# Patient Record
Sex: Male | Born: 1950 | Race: White | Hispanic: No | Marital: Married | Smoking: Never smoker
Health system: Southern US, Community
[De-identification: ages and names within clinical notes are randomized; demographics above are authoritative.]

## PROBLEM LIST (undated history)

## (undated) DIAGNOSIS — I2510 Atherosclerotic heart disease of native coronary artery without angina pectoris: Secondary | ICD-10-CM

## (undated) DIAGNOSIS — E119 Type 2 diabetes mellitus without complications: Secondary | ICD-10-CM

## (undated) DIAGNOSIS — E7800 Pure hypercholesterolemia, unspecified: Secondary | ICD-10-CM

## (undated) DIAGNOSIS — I255 Ischemic cardiomyopathy: Secondary | ICD-10-CM

## (undated) DIAGNOSIS — I513 Intracardiac thrombosis, not elsewhere classified: Secondary | ICD-10-CM

## (undated) DIAGNOSIS — I10 Essential (primary) hypertension: Secondary | ICD-10-CM

## (undated) DIAGNOSIS — Z91018 Allergy to other foods: Secondary | ICD-10-CM

## (undated) DIAGNOSIS — N1831 Chronic kidney disease, stage 3a: Secondary | ICD-10-CM

## (undated) DIAGNOSIS — I252 Old myocardial infarction: Secondary | ICD-10-CM

## (undated) DIAGNOSIS — N19 Unspecified kidney failure: Secondary | ICD-10-CM

## (undated) HISTORY — PX: HERNIA REPAIR: SHX51

## (undated) HISTORY — PX: CORONARY ARTERY BYPASS GRAFT: SHX141

---

## 1998-07-16 HISTORY — PX: OTHER SURGICAL HISTORY: SHX169

## 2011-07-17 HISTORY — PX: CORONARY ARTERY BYPASS GRAFT: SHX141

## 2020-05-21 ENCOUNTER — Encounter: Payer: Self-pay | Admitting: Emergency Medicine

## 2020-05-21 ENCOUNTER — Other Ambulatory Visit: Payer: Self-pay

## 2020-05-21 ENCOUNTER — Ambulatory Visit
Admission: EM | Admit: 2020-05-21 | Discharge: 2020-05-21 | Disposition: A | Discharge status: Home / Self Care | Payer: BC Managed Care – PPO | Attending: Emergency Medicine | Admitting: Emergency Medicine

## 2020-05-21 DIAGNOSIS — J069 Acute upper respiratory infection, unspecified: Secondary | ICD-10-CM

## 2020-05-21 HISTORY — DX: Old myocardial infarction: I25.2

## 2020-05-21 HISTORY — DX: Pure hypercholesterolemia, unspecified: E78.00

## 2020-05-21 HISTORY — DX: Essential (primary) hypertension: I10

## 2020-05-21 HISTORY — DX: Unspecified kidney failure: N19

## 2020-05-21 HISTORY — DX: Type 2 diabetes mellitus without complications: E11.9

## 2020-05-21 MED ORDER — CETIRIZINE HCL 10 MG PO TABS: 10.0000 mg | ORAL_TABLET | Freq: Every day | ORAL | 0 refills | Status: DC

## 2020-05-21 MED ORDER — AZITHROMYCIN 250 MG PO TABS: 250.0000 mg | ORAL_TABLET | Freq: Every day | ORAL | 0 refills | Status: DC

## 2020-05-21 MED ORDER — BENZONATATE 100 MG PO CAPS: 100.0000 mg | ORAL_CAPSULE | Freq: Three times a day (TID) | ORAL | 0 refills | Status: DC

## 2020-05-21 MED ORDER — DEXAMETHASONE 4 MG PO TABS: 4.0000 mg | ORAL_TABLET | Freq: Every day | ORAL | 0 refills | Status: AC

## 2020-05-21 NOTE — Discharge Instructions (Signed)
°  Get plenty of rest and push fluids Tessalon Perles prescribed for cough Zyrtec for nasal congestion, runny nose, and/or sore throat Decadron was prescribed Azithromycin was prescribed Use medications daily for symptom relief Use OTC medications like ibuprofen or tylenol as needed fever or pain Call or go to the ED if you have any new or worsening symptoms such as fever, worsening cough, shortness of breath, chest tightness, chest pain, turning blue, changes in mental status, etc..Marland Kitchen

## 2020-05-21 NOTE — ED Provider Notes (Signed)
[facility]   [id number] 05/21/20 Arrival Time: 1130   CC: URI symptoms  SUBJECTIVE: History from: patient.  Martin Hooper is a 69 y.o. male who presents to the urgent care for complaint of cough, nasal congestion and sore throat that started yesterday.  Denies sick exposure to COVID, flu or strep.  Denies recent travel.  Has tried OTC medication without relief.  Denies aggravating factors.  Denies previous symptoms in the past.   Denies fever, chills, fatigue, sinus pain, rhinorrhea,  SOB, wheezing, chest pain, nausea, changes in bowel or bladder habits.     ROS: As per HPI.  All other pertinent ROS negative.      Past Medical History:  Diagnosis Date  . Diabetes mellitus without complication (HCC)   . High cholesterol   . Hypertension   . Kidney failure    stage 3  . 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 CORONARY ARTERY BYPASS GRAFT     x 8 years ago   . HERNIA REPAIR     No Known Allergies No current facility-administered medications on file prior to encounter.   No current outpatient medications on file prior to encounter.   Social History   Socioeconomic History  . Marital status: Married    Spouse name: Not on file  . Number of children: Not on file  . Years of education: Not on file  . Highest education level: Not on file  Occupational History  . Not on file  Tobacco Use  . Smoking status: Never Smoker  . Smokeless tobacco: Never Used  Substance and Sexual Activity  . Alcohol use: Never  . Drug use: Never  . Sexual activity: Not on file  Other Topics Concern  . Not on file  Social History Narrative  . Not on file   Social Determinants of Health   Financial Resource Strain:   . Difficulty of Paying Living Expenses: Not on file  Food Insecurity:   . Worried About Programme researcher, broadcasting/film/video in the Last Year: Not on file  . Ran Out of Food in the Last Year: Not on file  Transportation Needs:   . Lack  of Transportation (Medical): Not on file  . Lack of Transportation (Non-Medical): Not on file  Physical Activity:   . Days of Exercise per Week: Not on file  . Minutes of Exercise per Session: Not on file  Stress:   . Feeling of Stress : Not on file  Social Connections:   . Frequency of Communication with Friends and Family: Not on file  . Frequency of Social Gatherings with Friends and Family: Not on file  . Attends Religious Services: Not on file  . Active Member of Clubs or Organizations: Not on file  . Attends Banker Meetings: Not on file  . Marital Status: Not on file  Intimate Partner Violence:   . Fear of Current or Ex-Partner: Not on file  . Emotionally Abused: Not on file  . Physically Abused: Not on file  . Sexually Abused: Not on file   No family history on file.  OBJECTIVE:  Vitals:   05/21/20 1150 05/21/20 1151  BP: 126/70   Pulse: 69   Resp: 19   Temp: 98.2 F (36.8 C)   TempSrc: Oral   SpO2: 96%   Weight:  205 lb (93 kg)  Height:  5\' 8"  (1.727 m)     General appearance:  alert; appears fatigued, but nontoxic; speaking in full sentences and tolerating own secretions HEENT: NCAT; Ears: EACs clear, TMs pearly gray; Eyes: PERRL.  EOM grossly intact. Sinuses: nontender; Nose: nares patent without rhinorrhea, Throat: oropharynx clear, tonsils non erythematous or enlarged, uvula midline  Neck: supple without LAD Lungs: unlabored respirations, symmetrical air entry; cough: moderate; no respiratory distress; CTAB Heart: regular rate and rhythm.  Radial pulses 2+ symmetrical bilaterally Skin: warm and dry Psychological: alert and cooperative; normal mood and affect  LABS:  No results found for this or any previous visit (from the past 24 hour(s)).   ASSESSMENT & PLAN:  1. Acute URI     Meds ordered this encounter  Medications  . benzonatate (TESSALON) 100 MG capsule    Sig: Take 1 capsule (100 mg total) by mouth every 8 (eight) hours.     Dispense:  30 capsule    Refill:  0  . azithromycin (ZITHROMAX) 250 MG tablet    Sig: Take 1 tablet (250 mg total) by mouth daily. Take first 2 tablets together, then 1 every day until finished.    Dispense:  6 tablet    Refill:  0  . dexamethasone (DECADRON) 4 MG tablet    Sig: Take 1 tablet (4 mg total) by mouth daily for 7 days.    Dispense:  7 tablet    Refill:  0  . cetirizine (ZYRTEC ALLERGY) 10 MG tablet    Sig: Take 1 tablet (10 mg total) by mouth daily.    Dispense:  30 tablet    Refill:  0   Discharge instructions  Get plenty of rest and push fluids Tessalon Perles prescribed for cough Zyrtec for nasal congestion, runny nose, and/or sore throat Decadron was prescribed Azithromycin was prescribed Use medications daily for symptom relief Use OTC medications like ibuprofen or tylenol as needed fever or pain Call or go to the ED if you have any new or worsening symptoms such as fever, worsening cough, shortness of breath, chest tightness, chest pain, turning blue, changes in mental status, etc...   Reviewed expectations re: course of current medical issues. Questions answered. Outlined signs and symptoms indicating need for more acute intervention. Patient verbalized understanding. After Visit Summary given.         Durward Parcel, FNP 05/21/20 1212

## 2020-05-21 NOTE — ED Triage Notes (Signed)
Runny nose, mild cough and sore throat since yesterday

## 2020-06-04 ENCOUNTER — Ambulatory Visit
Admission: EM | Admit: 2020-06-04 | Discharge: 2020-06-04 | Disposition: A | Discharge status: Home / Self Care | Payer: BC Managed Care – PPO | Attending: Emergency Medicine | Admitting: Emergency Medicine

## 2020-06-04 ENCOUNTER — Encounter: Payer: Self-pay | Admitting: Emergency Medicine

## 2020-06-04 ENCOUNTER — Other Ambulatory Visit: Payer: Self-pay

## 2020-06-04 DIAGNOSIS — R059 Cough, unspecified: Secondary | ICD-10-CM

## 2020-06-04 MED ORDER — ALBUTEROL SULFATE HFA 108 (90 BASE) MCG/ACT IN AERS
1.0000 | INHALATION_SPRAY | Freq: Four times a day (QID) | RESPIRATORY_TRACT | 0 refills | Status: DC | PRN
Start: 2020-06-04 — End: 2022-07-03

## 2020-06-04 MED ORDER — BENZONATATE 100 MG PO CAPS: 100.0000 mg | ORAL_CAPSULE | Freq: Three times a day (TID) | ORAL | 0 refills | Status: DC

## 2020-06-04 NOTE — Discharge Instructions (Addendum)
Tessalon Perles prescribed for cough/take as directed.  No more than 6 tablets in a day ProAir was prescribed/take as directed Use medications daily for symptom relief Use OTC medications like ibuprofen or tylenol as needed fever or pain Call or go to the ED if you have any new or worsening symptoms such as fever, worsening cough, shortness of breath, chest tightness, chest pain, turning blue, changes in mental status, etc..Marland Kitchen

## 2020-06-04 NOTE — ED Triage Notes (Signed)
Patient has dry cough x couple weeks, has finished meds. States he thinks he has bronchitis

## 2020-06-04 NOTE — ED Provider Notes (Signed)
[facility]   [id number] 06/04/20 Arrival Time: 1224   Chief Complaint  Patient presents with  . Cough     SUBJECTIVE: History from: patient and family.  Martin Hooper is a 69 y.o. male presented to the urgent care with a complaint of dry cough that is getting worse last night.  States he was seen previously 2 weeks ago and was prescribed azithromycin, Decadron, Tessalon Perles.  Reports symptom improvement.  Now patient states  he started having a dry cough again 2 days ago.  Denies sick exposure to COVID, flu or strep.  Denies recent travel.  . Denies any aggravating factors.  Denies previous symptoms in the past.   Denies fever, chills, fatigue, sinus pain, rhinorrhea, sore throat, SOB, wheezing, chest pain, nausea, changes in bowel or bladder habits.     ROS: As per HPI.  All other pertinent ROS negative.     Past Medical History:  Diagnosis Date  . Diabetes mellitus without complication (HCC)   . High cholesterol   . Hypertension   . Kidney failure    stage 3  . 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 CORONARY ARTERY BYPASS GRAFT     x 8 years ago   . HERNIA REPAIR     No Known Allergies No current facility-administered medications on file prior to encounter.   Current Outpatient Medications on File Prior to Encounter  Medication Sig Dispense Refill  . azithromycin (ZITHROMAX) 250 MG tablet Take 1 tablet (250 mg total) by mouth daily. Take first 2 tablets together, then 1 every day until finished. 6 tablet 0  . cetirizine (ZYRTEC ALLERGY) 10 MG tablet Take 1 tablet (10 mg total) by mouth daily. 30 tablet 0   Social History   Socioeconomic History  . Marital status: Married    Spouse name: Not on file  . Number of children: Not on file  . Years of education: Not on file  . Highest education level: Not on file  Occupational History  . Not on file  Tobacco Use  . Smoking status: Never Smoker  . Smokeless  tobacco: Never Used  Substance and Sexual Activity  . Alcohol use: Never  . Drug use: Never  . Sexual activity: Not on file  Other Topics Concern  . Not on file  Social History Narrative  . Not on file   Social Determinants of Health   Financial Resource Strain:   . Difficulty of Paying Living Expenses: Not on file  Food Insecurity:   . Worried About Programme researcher, broadcasting/film/video in the Last Year: Not on file  . Ran Out of Food in the Last Year: Not on file  Transportation Needs:   . Lack of Transportation (Medical): Not on file  . Lack of Transportation (Non-Medical): Not on file  Physical Activity:   . Days of Exercise per Week: Not on file  . Minutes of Exercise per Session: Not on file  Stress:   . Feeling of Stress : Not on file  Social Connections:   . Frequency of Communication with Friends and Family: Not on file  . Frequency of Social Gatherings with Friends and Family: Not on file  . Attends Religious Services: Not on file  . Active Member of Clubs or Organizations: Not on file  . Attends Banker Meetings: Not on file  . Marital Status: Not on file  Intimate Partner Violence:   .  Fear of Current or Ex-Partner: Not on file  . Emotionally Abused: Not on file  . Physically Abused: Not on file  . Sexually Abused: Not on file   No family history on file.  OBJECTIVE:  Vitals:   06/04/20 1314  BP: 105/61  Pulse: (!) 57  Resp: 16  Temp: 98.3 F (36.8 C)  SpO2: 98%     General appearance: alert; appears fatigued, but nontoxic; speaking in full sentences and tolerating own secretions HEENT: NCAT; Ears: EACs clear, TMs pearly gray; Eyes: PERRL.  EOM grossly intact. Sinuses: nontender; Nose: nares patent without rhinorrhea, Throat: oropharynx clear, tonsils non erythematous or enlarged, uvula midline  Neck: supple without LAD Lungs: unlabored respirations, symmetrical air entry; cough: moderate; no respiratory distress; CTAB Heart: regular rate and rhythm.   Radial pulses 2+ symmetrical bilaterally Skin: warm and dry Psychological: alert and cooperative; normal mood and affect  LABS:  No results found for this or any previous visit (from the past 24 hour(s)).   ASSESSMENT & PLAN:  1. Cough     Meds ordered this encounter  Medications  . albuterol (VENTOLIN HFA) 108 (90 Base) MCG/ACT inhaler    Sig: Inhale 1-2 puffs into the lungs every 6 (six) hours as needed for wheezing or shortness of breath.    Dispense:  18 g    Refill:  0  . benzonatate (TESSALON) 100 MG capsule    Sig: Take 1 capsule (100 mg total) by mouth every 8 (eight) hours.    Dispense:  30 capsule    Refill:  0    Discharge instructions  Tessalon Perles prescribed for cough/take as directed.  No more than 6 tablets in a day ProAir was prescribed/take as directed Use medications daily for symptom relief Use OTC medications like ibuprofen or tylenol as needed fever or pain Call or go to the ED if you have any new or worsening symptoms such as fever, worsening cough, shortness of breath, chest tightness, chest pain, turning blue, changes in mental status, etc...   Reviewed expectations re: course of current medical issues. Questions answered. Outlined signs and symptoms indicating need for more acute intervention. Patient verbalized understanding. After Visit Summary given.         Durward Parcel, FNP 06/04/20 1413

## 2020-06-08 ENCOUNTER — Other Ambulatory Visit: Payer: Self-pay

## 2020-06-08 ENCOUNTER — Emergency Department (HOSPITAL_COMMUNITY): Payer: BC Managed Care – PPO

## 2020-06-08 ENCOUNTER — Encounter (HOSPITAL_COMMUNITY): Payer: Self-pay | Admitting: Emergency Medicine

## 2020-06-08 ENCOUNTER — Emergency Department (HOSPITAL_COMMUNITY)
Admission: EM | Admit: 2020-06-08 | Discharge: 2020-06-08 | Disposition: A | Discharge status: Home / Self Care | Payer: BC Managed Care – PPO | Attending: Emergency Medicine | Admitting: Emergency Medicine

## 2020-06-08 DIAGNOSIS — I129 Hypertensive chronic kidney disease with stage 1 through stage 4 chronic kidney disease, or unspecified chronic kidney disease: Secondary | ICD-10-CM | POA: Diagnosis not present

## 2020-06-08 DIAGNOSIS — E1169 Type 2 diabetes mellitus with other specified complication: Secondary | ICD-10-CM | POA: Diagnosis not present

## 2020-06-08 DIAGNOSIS — I249 Acute ischemic heart disease, unspecified: Secondary | ICD-10-CM | POA: Insufficient documentation

## 2020-06-08 DIAGNOSIS — E1122 Type 2 diabetes mellitus with diabetic chronic kidney disease: Secondary | ICD-10-CM | POA: Diagnosis not present

## 2020-06-08 DIAGNOSIS — N1831 Chronic kidney disease, stage 3a: Secondary | ICD-10-CM | POA: Insufficient documentation

## 2020-06-08 DIAGNOSIS — R0602 Shortness of breath: Secondary | ICD-10-CM | POA: Insufficient documentation

## 2020-06-08 DIAGNOSIS — Z951 Presence of aortocoronary bypass graft: Secondary | ICD-10-CM | POA: Diagnosis not present

## 2020-06-08 DIAGNOSIS — R059 Cough, unspecified: Secondary | ICD-10-CM | POA: Diagnosis present

## 2020-06-08 DIAGNOSIS — E785 Hyperlipidemia, unspecified: Secondary | ICD-10-CM | POA: Insufficient documentation

## 2020-06-08 LAB — CBC WITH DIFFERENTIAL/PLATELET
Abs Immature Granulocytes: 0.01 10*3/uL (ref 0.00–0.07)
Basophils Absolute: 0 10*3/uL (ref 0.0–0.1)
Basophils Relative: 1 %
Eosinophils Absolute: 0.2 10*3/uL (ref 0.0–0.5)
Eosinophils Relative: 3 %
HCT: 42.7 % (ref 39.0–52.0)
Hemoglobin: 13.5 g/dL (ref 13.0–17.0)
Immature Granulocytes: 0 %
Lymphocytes Relative: 20 %
Lymphs Abs: 1.5 10*3/uL (ref 0.7–4.0)
MCH: 28.4 pg (ref 26.0–34.0)
MCHC: 31.6 g/dL (ref 30.0–36.0)
MCV: 89.9 fL (ref 80.0–100.0)
Monocytes Absolute: 0.9 10*3/uL (ref 0.1–1.0)
Monocytes Relative: 12 %
Neutro Abs: 5 10*3/uL (ref 1.7–7.7)
Neutrophils Relative %: 64 %
Platelets: 194 10*3/uL (ref 150–400)
RBC: 4.75 MIL/uL (ref 4.22–5.81)
RDW: 13.5 % (ref 11.5–15.5)
WBC: 7.7 10*3/uL (ref 4.0–10.5)
nRBC: 0 % (ref 0.0–0.2)

## 2020-06-08 LAB — BASIC METABOLIC PANEL
Anion gap: 9 (ref 5–15)
BUN: 23 mg/dL (ref 8–23)
CO2: 28 mmol/L (ref 22–32)
Calcium: 9.3 mg/dL (ref 8.9–10.3)
Chloride: 102 mmol/L (ref 98–111)
Creatinine, Ser: 1.5 mg/dL — ABNORMAL HIGH (ref 0.61–1.24)
GFR, Estimated: 50 mL/min — ABNORMAL LOW (ref >60)
Glucose, Bld: 179 mg/dL — ABNORMAL HIGH (ref 70–99)
Potassium: 4 mmol/L (ref 3.5–5.1)
Sodium: 139 mmol/L (ref 135–145)

## 2020-06-08 LAB — BRAIN NATRIURETIC PEPTIDE: B Natriuretic Peptide: 51 pg/mL (ref 0.0–100.0)

## 2020-06-08 MED ORDER — HYDROCODONE-HOMATROPINE 5-1.5 MG/5ML PO SYRP
5.0000 mL | ORAL_SOLUTION | Freq: Four times a day (QID) | ORAL | 0 refills | Status: DC | PRN
Start: 2020-06-08 — End: 2022-07-03

## 2020-06-08 NOTE — ED Triage Notes (Signed)
Pt has had a dry cough for the past 3 wks. Pt tested negative for covid at [facility] yesterday.  Pt was sent by his PCP for a chest x-ray. Pt had a fever of 100.7 for the past several day. Pt has had no fever and no tylenol today.  Pt has already had a course of antibiotic therepy.

## 2020-06-08 NOTE — ED Provider Notes (Signed)
[facility] EMERGENCY DEPARTMENT Provider Note   CSN: [id number] Arrival date & time: 06/08/20  1139     History Chief Complaint  Patient presents with  . Cough    Martin Hooper is a 69 y.o. male with PMH of type II DM, HTN, and ACS who presents the ED with complaints of cough.  On my examination, patient reports that he has been experiencing a cough x3 weeks.  He has been evaluated at an urgent care on multiple occasions and had been prescribed antibiotics and steroids, with some effect.  He also was given Jerilynn Som, but states that he has heard that they were useless.  He is not immunized for COVID-19.  He reports that he was tested earlier this week and was negative.  He lives at home with his wife who has not been ill.  He has a remote history of tobacco use, but stopped over 20 years ago.  He denies any history of asthma or COPD.  No recent surgeries or immobilizations.  During his most recent urgent care encounter, he was prescribed albuterol which he states has not provided any relief.  Over the course of the past few nights, he has needed to sleep a bit more upright due to mild SOB.  He also has been experiencing mild congestion and low-grade fever which has responded well to Tylenol.  He reports that he spoke with his primary care provider who advised him to come to the ED for x-ray.  Patient denies any difficulty breathing, chest pain, extremity swelling, abdominal pain, hemoptysis, or other symptoms.   HPI     Past Medical History:  Diagnosis Date  . Diabetes mellitus without complication (HCC)   . High cholesterol   . Hypertension   . Kidney failure    stage 3  . 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   Past Surgical History:  Procedure Laterality Date  . CORONARY ARTERY BYPASS GRAFT     x 8 years ago   . HERNIA REPAIR         History reviewed. No pertinent family history.  Social History   Tobacco Use  .  Smoking status: Never Smoker  . Smokeless tobacco: Never Used  Vaping Use  . Vaping Use: Never used  Substance Use Topics  . Alcohol use: Never  . Drug use: Never    Home Medications Prior to Admission medications   Medication Sig Start Date End Date Taking? Authorizing Provider  albuterol (VENTOLIN HFA) 108 (90 Base) MCG/ACT inhaler Inhale 1-2 puffs into the lungs every 6 (six) hours as needed for wheezing or shortness of breath. 06/04/20   Avegno, Zachery Dakins, FNP  azithromycin (ZITHROMAX) 250 MG tablet Take 1 tablet (250 mg total) by mouth daily. Take first 2 tablets together, then 1 every day until finished. 05/21/20   Avegno, Zachery Dakins, FNP  benzonatate (TESSALON) 100 MG capsule Take 1 capsule (100 mg total) by mouth every 8 (eight) hours. 06/04/20   Avegno, Zachery Dakins, FNP  cetirizine (ZYRTEC ALLERGY) 10 MG tablet Take 1 tablet (10 mg total) by mouth daily. 05/21/20   Avegno, Zachery Dakins, FNP  HYDROcodone-homatropine (HYCODAN) 5-1.5 MG/5ML syrup Take 5 mLs by mouth every 6 (six) hours as needed for cough. 06/08/20   Lorelee New, PA-C    Allergies    Patient has no known allergies.  Review of Systems   Review of Systems  All other systems reviewed and are negative.   Physical Exam Updated Vital Signs BP 140/79   Pulse 68   Temp 98.1 F (36.7 C) (Oral)   Resp 18   Ht 5\' 8"  (1.727 m)   Wt 90.7 kg   SpO2 95%   BMI 30.41 kg/m   Physical Exam Vitals and nursing note reviewed. Exam conducted with a chaperone present.  Constitutional:      General: He is not in acute distress. HENT:     Head: Normocephalic and atraumatic.  Eyes:     General: No scleral icterus.    Conjunctiva/sclera: Conjunctivae normal.  Cardiovascular:     Rate and Rhythm: Normal rate and regular rhythm.     Pulses: Normal pulses.     Heart sounds: Normal heart sounds.  Pulmonary:     Effort: Pulmonary effort is normal. No respiratory distress.     Breath sounds: Normal breath sounds. No  stridor. No wheezing or rales.     Comments: Lungs CTA bilaterally.  No wheezing or rales. Musculoskeletal:     Cervical back: Normal range of motion.     Right lower leg: No edema.     Left lower leg: No edema.     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  No pitting edema.  Skin:    General: Skin is dry.     Capillary Refill: Capillary refill takes less than 2 seconds.  Neurological:     Mental Status: He is alert.     GCS: GCS eye subscore is 4. GCS verbal subscore is 5. GCS motor subscore is 6.  Psychiatric:        Mood and Affect: Mood normal.        Behavior: Behavior normal.        Thought Content: Thought content normal.     ED Results / Procedures / Treatments   Labs (all labs ordered are listed, but only abnormal results are displayed) Labs Reviewed  BASIC METABOLIC PANEL - Abnormal; Notable for the following components:      Result Value   Glucose, Bld 179 (*)    Creatinine, Ser 1.50 (*)    GFR, Estimated 50 (*)    All other components within normal limits  CBC WITH DIFFERENTIAL/PLATELET  BRAIN NATRIURETIC PEPTIDE    EKG None  Radiology DG Chest Portable 1 View  Result Date: 06/08/2020 CLINICAL DATA:  Cough EXAM: PORTABLE CHEST 1 VIEW COMPARISON:  None. FINDINGS: Lungs are clear. Heart is upper normal in size with pulmonary vascularity normal. Patient is status post coronary artery bypass grafting. No adenopathy. No bone lesions. IMPRESSION: Lungs clear. Heart upper normal in size. Status post coronary artery bypass grafting. Electronically Signed   By: 06/10/2020 III M.D.   On: 06/08/2020 13:15    Procedures Procedures (including critical care time)  Medications Ordered in ED Medications - No data to display  ED Course  I have reviewed the triage vital signs and the nursing notes.  Pertinent labs & imaging results that were available during my care of the patient were reviewed by me and considered in my medical decision  making (see chart for details).    MDM Rules/Calculators/A&P                          Patient is on metoprolol, amlodipine, HCTZ, and lisinopril for his high blood pressure.  He also takes multiple medications for his type II DM.  He is presenting for subacute cough x3 weeks.  He also has had persistent intermittent low-grade fever and congestion symptoms.  He did not want to be vaccinated for COVID-19.  He denies any obvious exposures at work.  Labs CBC with differential: No anemia or leukocytosis concerning for infection. BNP: 51.0 BMP: Elevated creatinine to 1.50 and reduced GFR to 50.  No electrolyte derangement.  Mild hyperglycemia to 179.  I personally reviewed plain films obtained of chest which demonstrate no acute cardiopulmonary disease.  He tells me that the reason why he had difficulty sleeping yesterday and the day before was purely because he had turned on the heat in his house and states that he has been particularly dry.  He does not have a humidifier.  I encouraged him to get a humidifier.  He is also on an ACE inhibitor which could be contributing to his subacute cough.  Encouraging him to follow-up with his primary care provider regarding this medication as it could be a contributing factor.  However, given his low-grade fevers, mild congestion, and mild sore throat, suspect that this could be ongoing viral infection.  Patient declined respiratory panel by PCR given that he recently was tested for COVID-19 just 2 days ago by PCR was negative.    Final Clinical Impression(s) / ED Diagnoses Final diagnoses:  Cough    Rx / DC Orders ED Discharge Orders         Ordered    HYDROcodone-homatropine (HYCODAN) 5-1.5 MG/5ML syrup  Every 6 hours PRN        06/08/20 1502           Lorelee New, PA-C 06/08/20 1505    Bethann Berkshire, MD 06/12/20 [phone]

## 2020-06-08 NOTE — Discharge Instructions (Addendum)
Please continue with throat lozenges and Tessalon Perles antitussive for your cough symptoms.  Given that you have had persistent cough and discomfort with cough, I have prescribed you Hycodan syrup.  You are not allowed to drive or work if you are taking Hycodan syrup as it includes hydrocodone, a narcotic medication.  This can make you particularly drowsy.  Do not combine with alcohol.  Your chest x-ray and upper work-up is reassuring.  Please follow-up with your primary care provider regarding today's encounter for ongoing evaluation.  As we discussed, I suspect this is a viral upper respiratory infection.  However, you are on a medication which can sometimes cause a subacute/chronic cough.  Please discuss this with your PCP.  Additionally, I recommend that you obtain a humidifier for your home.    Please return to the ED or seek immediate medical attention should you experience any new or worsening symptoms.

## 2020-12-23 ENCOUNTER — Ambulatory Visit: Payer: BC Managed Care – PPO | Admitting: Neurology

## 2021-01-06 ENCOUNTER — Ambulatory Visit: Payer: BC Managed Care – PPO | Admitting: Neurology

## 2021-12-18 IMAGING — DX DG CHEST 1V PORT
1 series · 1 of 1 positions shown · non-contrast
Comparison: None.

CLINICAL DATA: Cough

EXAM:
PORTABLE CHEST 1 VIEW

[chest ap]
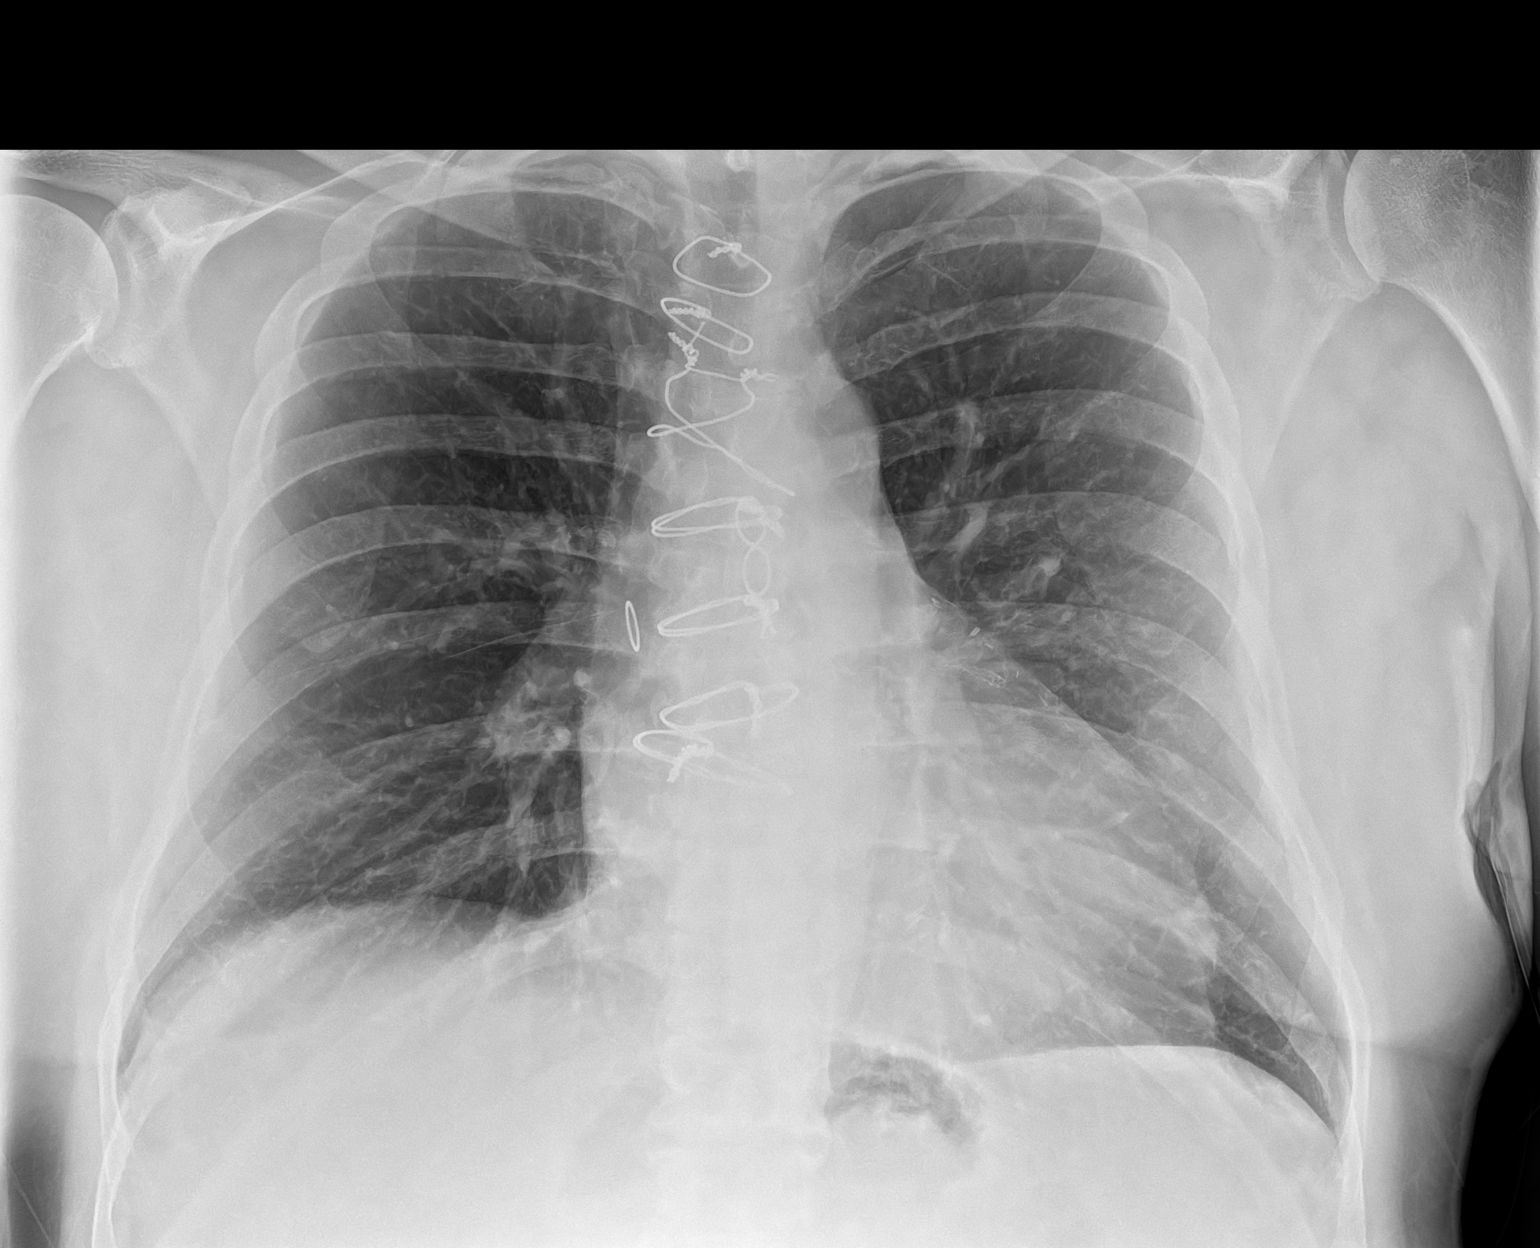

[1 of 1 positions shown; findings below may reference images not displayed]

FINDINGS: Lungs are clear. Heart is upper normal in size with pulmonary
vascularity normal. Patient is status post coronary artery bypass
grafting. No adenopathy. No bone lesions.
IMPRESSION: Lungs clear. Heart upper normal in size. Status post coronary artery
bypass grafting.

## 2022-07-02 ENCOUNTER — Encounter (HOSPITAL_COMMUNITY): Payer: Self-pay

## 2022-07-02 ENCOUNTER — Emergency Department (HOSPITAL_COMMUNITY)
Admission: EM | Admit: 2022-07-02 | Discharge: 2022-07-03 | Discharge status: Against Medical Advice | Payer: BC Managed Care – PPO | Attending: Emergency Medicine | Admitting: Emergency Medicine

## 2022-07-02 ENCOUNTER — Emergency Department (HOSPITAL_COMMUNITY): Payer: BC Managed Care – PPO

## 2022-07-02 ENCOUNTER — Other Ambulatory Visit: Payer: Self-pay

## 2022-07-02 DIAGNOSIS — R0689 Other abnormalities of breathing: Secondary | ICD-10-CM | POA: Insufficient documentation

## 2022-07-02 DIAGNOSIS — R079 Chest pain, unspecified: Secondary | ICD-10-CM | POA: Insufficient documentation

## 2022-07-02 DIAGNOSIS — Z5321 Procedure and treatment not carried out due to patient leaving prior to being seen by health care provider: Secondary | ICD-10-CM | POA: Insufficient documentation

## 2022-07-02 LAB — COMPREHENSIVE METABOLIC PANEL
ALT: 21 U/L (ref 0–44)
AST: 24 U/L (ref 15–41)
Albumin: 3.5 g/dL (ref 3.5–5.0)
Alkaline Phosphatase: 46 U/L (ref 38–126)
Anion gap: 10 (ref 5–15)
BUN: 27 mg/dL — ABNORMAL HIGH (ref 8–23)
CO2: 25 mmol/L (ref 22–32)
Calcium: 9.6 mg/dL (ref 8.9–10.3)
Chloride: 102 mmol/L (ref 98–111)
Creatinine, Ser: 1.63 mg/dL — ABNORMAL HIGH (ref 0.61–1.24)
GFR, Estimated: 45 mL/min — ABNORMAL LOW (ref >60)
Glucose, Bld: 194 mg/dL — ABNORMAL HIGH (ref 70–99)
Potassium: 4 mmol/L (ref 3.5–5.1)
Sodium: 137 mmol/L (ref 135–145)
Total Bilirubin: 0.7 mg/dL (ref 0.3–1.2)
Total Protein: 6.2 g/dL — ABNORMAL LOW (ref 6.5–8.1)

## 2022-07-02 LAB — CBC WITH DIFFERENTIAL/PLATELET
Abs Immature Granulocytes: 0.04 10*3/uL (ref 0.00–0.07)
Basophils Absolute: 0.1 10*3/uL (ref 0.0–0.1)
Basophils Relative: 1 %
Eosinophils Absolute: 0.3 10*3/uL (ref 0.0–0.5)
Eosinophils Relative: 2 %
HCT: 40.8 % (ref 39.0–52.0)
Hemoglobin: 13.4 g/dL (ref 13.0–17.0)
Immature Granulocytes: 0 %
Lymphocytes Relative: 18 %
Lymphs Abs: 2.1 10*3/uL (ref 0.7–4.0)
MCH: 29.5 pg (ref 26.0–34.0)
MCHC: 32.8 g/dL (ref 30.0–36.0)
MCV: 89.9 fL (ref 80.0–100.0)
Monocytes Absolute: 0.7 10*3/uL (ref 0.1–1.0)
Monocytes Relative: 6 %
Neutro Abs: 8.6 10*3/uL — ABNORMAL HIGH (ref 1.7–7.7)
Neutrophils Relative %: 73 %
Platelets: 285 10*3/uL (ref 150–400)
RBC: 4.54 MIL/uL (ref 4.22–5.81)
RDW: 13.2 % (ref 11.5–15.5)
WBC: 11.8 10*3/uL — ABNORMAL HIGH (ref 4.0–10.5)
nRBC: 0 % (ref 0.0–0.2)

## 2022-07-02 LAB — TROPONIN I (HIGH SENSITIVITY)
Troponin I (High Sensitivity): 33 ng/L — ABNORMAL HIGH (ref <18)
Troponin I (High Sensitivity): 26 ng/L — ABNORMAL HIGH (ref <18)

## 2022-07-02 LAB — BRAIN NATRIURETIC PEPTIDE: B Natriuretic Peptide: 99 pg/mL (ref 0.0–100.0)

## 2022-07-02 NOTE — ED Triage Notes (Signed)
Pt reports chest pain on both sides for the past 2 weeks, denies any other symptom, hx of bypass surgery several years ago, denies pain at this time.

## 2022-07-02 NOTE — ED Provider Triage Note (Signed)
Emergency Medicine Provider Triage Evaluation Note  Martin Hooper , a 71 y.o. male  was evaluated in triage.  Pt complains of chest pain that began several days ago. States that he felt that symptoms worsened after being outside in the cold, but no exertional trigger specifically noted. Prior history of CAD with multiple stents and bypass surgeries years ago. Denies nausea, fever, headache, abdominal pain, diarrhea, or shortness of breath.  Review of Systems  Positive: As above Negative: As above  Physical Exam  BP 131/82   Pulse 62   Temp 98.2 F (36.8 C)   Resp 16   Ht 5\' 8"  (1.727 m)   Wt 90.7 kg   SpO2 97%   BMI 30.41 kg/m  Gen:   Awake, no distress Resp:  Normal effort, no wheezing or crackles MSK:   Moves extremities without difficulty Other:  HR boderline bradycardic, regular rhythm  Medical Decision Making  Medically screening exam initiated at 12:43 PM.  Appropriate orders placed.  KORBEN CARCIONE was informed that the remainder of the evaluation will be completed by another provider, this initial triage assessment does not replace that evaluation, and the importance of remaining in the ED until their evaluation is complete.     Colbert Coyer, PA-C 07/02/22 1245

## 2022-07-02 NOTE — ED Notes (Signed)
[facility]

## 2022-07-03 ENCOUNTER — Emergency Department (HOSPITAL_COMMUNITY): Payer: BC Managed Care – PPO

## 2022-07-03 ENCOUNTER — Encounter (HOSPITAL_COMMUNITY): Payer: Self-pay

## 2022-07-03 ENCOUNTER — Emergency Department (HOSPITAL_COMMUNITY)
Admission: EM | Admit: 2022-07-03 | Discharge: 2022-07-03 | Disposition: A | Discharge status: Home / Self Care | Payer: BC Managed Care – PPO | Source: Home / Self Care | Attending: Emergency Medicine | Admitting: Emergency Medicine

## 2022-07-03 ENCOUNTER — Other Ambulatory Visit: Payer: Self-pay

## 2022-07-03 DIAGNOSIS — Z7982 Long term (current) use of aspirin: Secondary | ICD-10-CM | POA: Insufficient documentation

## 2022-07-03 DIAGNOSIS — I10 Essential (primary) hypertension: Secondary | ICD-10-CM | POA: Insufficient documentation

## 2022-07-03 DIAGNOSIS — Z7984 Long term (current) use of oral hypoglycemic drugs: Secondary | ICD-10-CM | POA: Insufficient documentation

## 2022-07-03 DIAGNOSIS — Z79899 Other long term (current) drug therapy: Secondary | ICD-10-CM | POA: Insufficient documentation

## 2022-07-03 DIAGNOSIS — R001 Bradycardia, unspecified: Secondary | ICD-10-CM | POA: Insufficient documentation

## 2022-07-03 DIAGNOSIS — Z794 Long term (current) use of insulin: Secondary | ICD-10-CM | POA: Insufficient documentation

## 2022-07-03 DIAGNOSIS — R0789 Other chest pain: Secondary | ICD-10-CM | POA: Insufficient documentation

## 2022-07-03 DIAGNOSIS — I2510 Atherosclerotic heart disease of native coronary artery without angina pectoris: Secondary | ICD-10-CM | POA: Insufficient documentation

## 2022-07-03 DIAGNOSIS — E119 Type 2 diabetes mellitus without complications: Secondary | ICD-10-CM | POA: Insufficient documentation

## 2022-07-03 DIAGNOSIS — R079 Chest pain, unspecified: Secondary | ICD-10-CM

## 2022-07-03 LAB — TROPONIN I (HIGH SENSITIVITY)
Troponin I (High Sensitivity): 16 ng/L (ref <18)
Troponin I (High Sensitivity): 16 ng/L (ref <18)

## 2022-07-03 LAB — BASIC METABOLIC PANEL
Anion gap: 9 (ref 5–15)
BUN: 32 mg/dL — ABNORMAL HIGH (ref 8–23)
CO2: 25 mmol/L (ref 22–32)
Calcium: 8.8 mg/dL — ABNORMAL LOW (ref 8.9–10.3)
Chloride: 105 mmol/L (ref 98–111)
Creatinine, Ser: 1.71 mg/dL — ABNORMAL HIGH (ref 0.61–1.24)
GFR, Estimated: 42 mL/min — ABNORMAL LOW (ref >60)
Glucose, Bld: 141 mg/dL — ABNORMAL HIGH (ref 70–99)
Potassium: 3.8 mmol/L (ref 3.5–5.1)
Sodium: 139 mmol/L (ref 135–145)

## 2022-07-03 LAB — CBC
HCT: 41.6 % (ref 39.0–52.0)
Hemoglobin: 13.7 g/dL (ref 13.0–17.0)
MCH: 29.3 pg (ref 26.0–34.0)
MCHC: 32.9 g/dL (ref 30.0–36.0)
MCV: 88.9 fL (ref 80.0–100.0)
Platelets: 268 10*3/uL (ref 150–400)
RBC: 4.68 MIL/uL (ref 4.22–5.81)
RDW: 13.2 % (ref 11.5–15.5)
WBC: 13.2 10*3/uL — ABNORMAL HIGH (ref 4.0–10.5)
nRBC: 0 % (ref 0.0–0.2)

## 2022-07-03 LAB — D-DIMER, QUANTITATIVE: D-Dimer, Quant: 0.7 ug/mL-FEU — ABNORMAL HIGH (ref 0.00–0.50)

## 2022-07-03 MED ORDER — PREDNISONE 10 MG PO TABS: 30.0000 mg | ORAL_TABLET | Freq: Every day | ORAL | 0 refills | Status: AC

## 2022-07-03 NOTE — ED Notes (Signed)
Patient transported to CT 

## 2022-07-03 NOTE — ED Triage Notes (Signed)
Pt presents to ED with complaints of chest pain on both sides x 2 weeks, denies other symptoms. Hx bypass surgery several years ago, denies pain at this time. Pt went to [facility] yesterday but was never seen. PCP called today with results and told to come back to ED

## 2022-07-03 NOTE — Discharge Instructions (Signed)
There is a prescription for steroids sent to pharmacy for treatment of inflammation.  You should hear from the cardiology office to set up a follow-up appointment to reestablish care.  If you do not hear from their office, call number below.  If you develop any new or worsening symptoms of concern, please return to the emergency department.

## 2022-07-03 NOTE — ED Provider Notes (Signed)
[facility]ENCY [facility]PARTMENT Provider Note   CSN: [id number] Arrival date & time: 07/03/22  1322     History  Chief Complaint  Patient presents with   Chest Pain    Martin Hooper is a 71 y.o. male.   Chest Pain Patient presents for chest pain.  Medical history includes DM, HLD, HTN, CAD.  Chest pain has been bilateral, migrating, and intermittent for the past 2 weeks.  He went to [facility] yesterday but left without being seen.  Per chart review, lab work yesterday was notable for a leukocytosis and mild elevation in troponin with decrease on the repeat.  Today, patient has had ongoing, very mild, migrating pains throughout both sides of his chest.  He denies any associated shortness of breath.  His pain is not worsened with exertion.  He does feel that it may be worsened after meals.  Currently, patient takes Nexium and has been taking simethicone for the past 2 weeks.  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  He underwent stenting at that time.  10 years ago, he underwent bypass surgery.  He has been lost to follow-up with his cardiologist for the past several years.     Home Medications Prior to Admission medications   Medication Sig Start Date End Date Taking? Authorizing Provider  amLODipine (NORVASC) 10 MG tablet Take 10 mg by mouth daily.   Yes [provider]  ascorbic acid (VITAMIN C) 500 MG tablet Take 500 mg by mouth daily.   Yes [provider]  aspirin EC 81 MG tablet Take by mouth. 11/14/09  Yes [provider]  atorvastatin (LIPITOR) 80 MG tablet Take 80 mg by mouth daily.   Yes [provider]  benazepril (LOTENSIN) 40 MG tablet Take 40 mg by mouth daily.   Yes [provider]  Cholecalciferol 50 MCG (2000 UT) CAPS Take 5,000 Units by mouth daily.   Yes [provider]  esomeprazole (NEXIUM) 20 MG capsule Take 20 mg by mouth daily.   Yes [provider]  ferrous sulfate 325 (65 FE) MG  tablet Take 325 mg by mouth daily with breakfast.   Yes [provider]  hydrochlorothiazide (HYDRODIURIL) 25 MG tablet Take 25 mg by mouth daily.   Yes [provider]  metFORMIN (GLUCOPHAGE-XR) 500 MG 24 hr tablet Take 1,000 mg by mouth 2 (two) times daily with a meal.   Yes [provider]  metoprolol succinate (TOPROL-XL) 100 MG 24 hr tablet Take 100 mg by mouth daily.   Yes [provider]  predniSONE (DELTASONE) 10 MG tablet Take 3 tablets (30 mg total) by mouth daily for 5 days. 07/03/22 07/08/22 Yes Gloris Manchesterixon, Onelia Cadmus, MD  TRESIBA FLEXTOUCH 200 UNIT/ML FlexTouch Pen Inject 68 Units into the skin daily.   Yes [provider]  Zinc 30 MG CAPS Take 1 capsule by mouth daily.   Yes [provider]  tamsulosin (FLOMAX) 0.4 MG CAPS capsule Take 0.4 mg by mouth every evening.    [provider]      Allergies    Alpha-gal, Beef (bovine) protein, and Pork-derived products    Review of Systems   Review of Systems  Cardiovascular:  Positive for chest pain.  All other systems reviewed and are negative.   Physical Exam Updated Vital Signs BP (!) 146/72   Pulse (!) 56   Temp 98.1 F (36.7 C) (Oral)   Resp 19   Ht 5\' 8"  (1.727  m)   Wt 90.7 kg   SpO2 100%   BMI 30.41 kg/m  Physical Exam Vitals and nursing note reviewed.  Constitutional:      General: He is not in acute distress.    Appearance: He is well-developed. He is not ill-appearing, toxic-appearing or diaphoretic.  HENT:     Head: Normocephalic and atraumatic.  Eyes:     Conjunctiva/sclera: Conjunctivae normal.  Neck:     Vascular: No JVD.  Cardiovascular:     Rate and Rhythm: Normal rate and regular rhythm.     Heart sounds: No murmur heard. Pulmonary:     Effort: Pulmonary effort is normal. No tachypnea or respiratory distress.     Breath sounds: No decreased breath sounds, wheezing, rhonchi or rales.  Chest:     Chest wall: Tenderness present.  Abdominal:      Palpations: Abdomen is soft.     Tenderness: There is no abdominal tenderness.  Musculoskeletal:        General: No swelling.     Cervical back: Normal range of motion and neck supple.     Right lower leg: No edema.     Left lower leg: No edema.  Skin:    General: Skin is warm and dry.     Capillary Refill: Capillary refill takes less than 2 seconds.     Coloration: Skin is not cyanotic or pale.  Neurological:     General: No focal deficit present.     Mental Status: He is alert and oriented to person, place, and time.  Psychiatric:        Mood and Affect: Mood normal.        Behavior: Behavior normal.     ED Results / Procedures / Treatments   Labs (all labs ordered are listed, but only abnormal results are displayed) Labs Reviewed  BASIC METABOLIC PANEL - Abnormal; Notable for the following components:      Result Value   Glucose, Bld 141 (*)    BUN 32 (*)    Creatinine, Ser 1.71 (*)    Calcium 8.8 (*)    GFR, Estimated 42 (*)    All other components within normal limits  CBC - Abnormal; Notable for the following components:   WBC 13.2 (*)    All other components within normal limits  D-DIMER, QUANTITATIVE - Abnormal; Notable for the following components:   D-Dimer, Quant 0.70 (*)    All other components within normal limits  TROPONIN I (HIGH SENSITIVITY)  TROPONIN I (HIGH SENSITIVITY)    EKG EKG Interpretation  Date/Time:  Tuesday July 03 2022 13:47:22 EST Ventricular Rate:  56 PR Interval:  188 QRS Duration: 98 QT Interval:  434 QTC Calculation: 418 R Axis:   -1 Text Interpretation: Sinus bradycardia Confirmed by Gloris Manchester ([id number]) on 07/03/2022 4:53:14 PM  Radiology CT Chest Wo Contrast  Result Date: 07/03/2022 CLINICAL DATA:  Chest wall pain EXAM: CT CHEST WITHOUT CONTRAST TECHNIQUE: Multidetector CT imaging of the chest was performed following the standard protocol without IV contrast. RADIATION DOSE REDUCTION: This exam was performed according to  the departmental dose-optimization program which includes automated exposure control, adjustment of the mA and/or kV according to patient size and/or use of iterative reconstruction technique. COMPARISON:  Chest x-ray 07/03/2022 FINDINGS: Cardiovascular: Limited evaluation without intravenous contrast. Status post CABG. Coronary vascular calcification. Nonaneurysmal aorta. Mild atherosclerosis. Normal cardiac size. No pericardial effusion Mediastinum/Nodes: No enlarged mediastinal or axillary lymph nodes. Thyroid gland, trachea, and esophagus demonstrate no  significant findings. Lungs/Pleura: Mild emphysema. No acute airspace disease, pleural effusion or pneumothorax Upper Abdomen: No acute abnormality. Partially visualized upper pole renal cyst on the right, no specific imaging follow-up is recommended Musculoskeletal: Post sternotomy changes. No acute osseous abnormality. IMPRESSION: 1. No CT evidence for acute intrathoracic abnormality 2. Mild emphysema Aortic Atherosclerosis (ICD10-I70.0) and Emphysema (ICD10-J43.9). Electronically Signed   By: Jasmine Pang M.D.   On: 07/03/2022 17:09   DG Chest 2 View  Result Date: 07/03/2022 CLINICAL DATA:  Chest pain. EXAM: CHEST - 2 VIEW COMPARISON:  07/02/2022 FINDINGS: The cardiac silhouette, mediastinal and hilar contours are within normal limits and stable. Stable surgical changes from triple bypass surgery. The lungs are clear of an acute process. No infiltrates, edema or effusions. No pulmonary lesions. The bony thorax is intact. IMPRESSION: No acute cardiopulmonary findings. Electronically Signed   By: Rudie Meyer M.D.   On: 07/03/2022 14:29   DG Chest 2 View  Result Date: 07/02/2022 CLINICAL DATA:  Chest pain that began several days ago, coronary artery disease post stenting and bypass surgery EXAM: CHEST - 2 VIEW COMPARISON:  06/08/2020 FINDINGS: Borderline enlargement of cardiac silhouette post CABG. Mediastinal contours and pulmonary vascularity  normal. Lungs clear. No pulmonary infiltrate, pleural effusion, or pneumothorax. Osseous structures unremarkable. IMPRESSION: No acute abnormalities. Electronically Signed   By: Ulyses Southward M.D.   On: 07/02/2022 13:05    Procedures Procedures    Medications Ordered in ED Medications - No data to display  ED Course/ Medical Decision Making/ A&P                           Medical Decision Making Amount and/or Complexity of Data Reviewed Labs: ordered. Radiology: ordered.   Patient presents for 2 weeks of intermittent, mild, migrating chest pain.  He does feel like it is slightly worsened postprandially.  It does not worsen with exertion.  He does have a history of CAD and underwent stenting 23 years ago and bypass surgery 10 years ago.  He has been lost to follow-up with his cardiologist.  On arrival in the ED, vital signs are normal.  EKG from yesterday and today was reviewed and did not show any concerning ST segment changes.  Lab work from yesterday and today shows continuing downtrending troponins: 33-->26-->16.  Other lab work is notable for mild leukocytosis which is slightly increased today.  He has not had any recent infectious symptoms.  Chest x-ray shows no acute findings.  On exam, patient is well-appearing.  His breathing is unlabored.  SpO2 is 99% on room air.  Do not appreciate any cardiac rubs or murmurs.  Lungs are clear to auscultation.  He does not have any lower extremity swelling.  He does state that his symptoms are improved with leaning forward.  D-dimer was normal when age-adjusted.  Second troponin was normal.  Noncontrasted CT scan showed emphysema with no acute findings.  Patient was prescribed short course of steroid.  Cardiology referral was ordered.  Patient was advised to return for any worsening symptoms.  He was discharged in stable condition.        Final Clinical Impression(s) / ED Diagnoses Final diagnoses:  Chest pain, unspecified type    Rx / DC  Orders ED Discharge Orders          Ordered    predniSONE (DELTASONE) 10 MG tablet  Daily        07/03/22 1825    Ambulatory  referral to Cardiology       Comments: If you have not heard from the Cardiology office within the next 72 hours please call [phone].   07/03/22 1825              Gloris Manchester, MD 07/03/22 1826

## 2022-07-12 ENCOUNTER — Ambulatory Visit: Payer: BC Managed Care – PPO | Attending: Internal Medicine | Admitting: Internal Medicine

## 2022-07-12 ENCOUNTER — Encounter: Payer: Self-pay | Admitting: Internal Medicine

## 2022-07-12 VITALS — BP 143/65 | HR 48 | Wt 209.8 lb

## 2022-07-12 DIAGNOSIS — I25119 Atherosclerotic heart disease of native coronary artery with unspecified angina pectoris: Secondary | ICD-10-CM | POA: Diagnosis not present

## 2022-07-12 DIAGNOSIS — I10 Essential (primary) hypertension: Secondary | ICD-10-CM | POA: Diagnosis not present

## 2022-07-12 DIAGNOSIS — I25810 Atherosclerosis of coronary artery bypass graft(s) without angina pectoris: Secondary | ICD-10-CM

## 2022-07-12 DIAGNOSIS — E7849 Other hyperlipidemia: Secondary | ICD-10-CM | POA: Diagnosis not present

## 2022-07-12 DIAGNOSIS — I25709 Atherosclerosis of coronary artery bypass graft(s), unspecified, with unspecified angina pectoris: Secondary | ICD-10-CM | POA: Insufficient documentation

## 2022-07-12 DIAGNOSIS — E785 Hyperlipidemia, unspecified: Secondary | ICD-10-CM | POA: Insufficient documentation

## 2022-07-12 MED ORDER — NITROGLYCERIN 0.4 MG SL SUBL: 0.4000 mg | SUBLINGUAL_TABLET | SUBLINGUAL | 3 refills | Status: DC | PRN

## 2022-07-12 MED ORDER — EZETIMIBE 10 MG PO TABS: 10.0000 mg | ORAL_TABLET | Freq: Every day | ORAL | 3 refills | Status: DC

## 2022-07-12 NOTE — Patient Instructions (Addendum)
Medication Instructions:  Start Nitroglycerin 0.4 mg as needed Start Zetia 10 mg daily  *If you need a refill on your cardiac medications before your next appointment, please call your pharmacy*   Lab Work: Your physician recommends that you complete lab work today. ESR & CRP  If you have labs (blood work) drawn today and your tests are completely normal, you will receive your results only by: MyChart Message (if you have MyChart) OR A paper copy in the mail If you have any lab test that is abnormal or we need to change your treatment, we will call you to review the results.   Testing/Procedures: Your physician has requested that you have an echocardiogram. Echocardiography is a painless test that uses sound waves to create images of your heart. It provides your doctor with information about the size and shape of your heart and how well your heart's chambers and valves are working. This procedure takes approximately one hour. There are no restrictions for this procedure. Please do NOT wear cologne, perfume, aftershave, or lotions (deodorant is allowed). Please arrive 15 minutes prior to your appointment time.   Your physician has requested that you have en exercise stress myoview. For further information please visit [email or web address]. Please follow instruction sheet, as given.    Follow-Up: At [facility], you and your health needs are our priority.  As part of our continuing mission to provide you with exceptional heart care, we have created designated Provider Care Teams.  These Care Teams include your primary Cardiologist (physician) and Advanced Practice Providers (APPs -  Physician Assistants and Nurse Practitioners) who all work together to provide you with the care you need, when you need it.  We recommend signing up for the patient portal called "MyChart".  Sign up information is provided on this After Visit Summary.  MyChart is used to connect with patients for  Virtual Visits (Telemedicine).  Patients are able to view lab/test results, encounter notes, upcoming appointments, etc.  Non-urgent messages can be sent to your provider as well.   To learn more about what you can do with MyChart, go to [email or web address].    Your next appointment:   6 month(s)  The format for your next appointment:   In Person  Provider:   Claudina Lick, MD    Other Instructions Check blood pressure at home. If blood pressure is high, please contact your primary care physician.   Important Information About Sugar      \

## 2022-07-12 NOTE — Progress Notes (Signed)
Cardiology Office Note  Date: 07/12/2022   ID: Martin, Hooper 08-16-1950, MRN [id number]  PCP:  Caryl Bis, MD  Cardiologist:  None Electrophysiologist:  None   Reason for Office Visit: Evaluation of CAD at the request of Dr. Doren Custard   History of Present Illness: Martin Hooper is a 71 y.o. male known to have CAD s/p PCI in 2000, 5V CABG (LIMA to LAD, SVG to OM 2, OM 3, D1, PDA) in 2013 with LVEF 45% and apical akinesis, HTN, DM2, CKD stage III was referred to cardiology clinic for evaluation of chest pain/CAD at the request of Dr. Doren Custard.  Patient was following with [facility] until 2013 and was lost to follow-up. He presented to the ER in 12/23 with migratory chest pains and was referred to outpatient cardiology to establish care.  Patient states that he started to have chest pains x 3 weeks, occurs multiple times a week and in different locations (substernal, right side and left side of his chest), last for a good while (approximately 30 minutes and even more than that), worsens after eating food or after exposure to cold weather and resolves with rest.  When he is indoors, he does not have any of these chest pains. Troponins were checked in the ER in 12/23 which were within normal limits. Denies any DOE, dizziness, lightness, syncope, LE swelling. Former smoker, quit in 1990s, denied alcohol use and illicit drug abuse.  Past Medical History:  Diagnosis Date   Diabetes mellitus without complication (HCC)    High cholesterol    Hypertension    Kidney failure    stage 3   MI, 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    Past Surgical History:  Procedure Laterality Date   CORONARY ARTERY BYPASS GRAFT     x 8 years ago    HERNIA REPAIR      Current Outpatient Medications  Medication Sig Dispense Refill   amLODipine (NORVASC) 10 MG tablet Take 10 mg by mouth daily.     ascorbic acid (VITAMIN C) 500 MG tablet Take 500 mg by mouth daily.     aspirin EC 81 MG tablet Take by mouth.      atorvastatin (LIPITOR) 80 MG tablet Take 80 mg by mouth daily.     benazepril (LOTENSIN) 40 MG tablet Take 40 mg by mouth daily.     Cholecalciferol 50 MCG (2000 UT) CAPS Take 5,000 Units by mouth daily.     esomeprazole (NEXIUM) 20 MG capsule Take 20 mg by mouth daily.     ezetimibe (ZETIA) 10 MG tablet Take 1 tablet (10 mg total) by mouth daily. 90 tablet 3   ferrous sulfate 325 (65 FE) MG tablet Take 325 mg by mouth daily with breakfast.     hydrochlorothiazide (HYDRODIURIL) 25 MG tablet Take 25 mg by mouth daily.     metFORMIN (GLUCOPHAGE-XR) 500 MG 24 hr tablet Take 1,000 mg by mouth 2 (two) times daily with a meal.     metoprolol succinate (TOPROL-XL) 100 MG 24 hr tablet Take 100 mg by mouth daily.     nitroGLYCERIN (NITROSTAT) 0.4 MG SL tablet Place 1 tablet (0.4 mg total) under the tongue every 5 (five) minutes as needed for chest pain. 90 tablet 3   tamsulosin (FLOMAX) 0.4 MG CAPS capsule Take 0.4 mg by mouth every evening.     TRESIBA FLEXTOUCH 200 UNIT/ML FlexTouch Pen Inject 68 Units into the skin daily.     Zinc 30  MG CAPS Take 1 capsule by mouth daily.     No current facility-administered medications for this visit.   Allergies:  Alpha-gal, Beef (bovine) protein, and Pork-derived products   Social History: The patient  reports that he has never smoked. He has never used smokeless tobacco. He reports that he does not drink alcohol and does not use drugs.   Family History: The patient's family history is not on file.   ROS:  Please see the history of present illness. Otherwise, complete review of systems is positive for none.  All other systems are reviewed and negative.   Physical Exam: VS:  BP (!) 143/65 (BP Location: Left Arm, Patient Position: Sitting, Cuff Size: Large)   Pulse (!) 48   Wt 209 lb 12.8 oz (95.2 kg)   BMI 31.90 kg/m , BMI Body mass index is 31.9 kg/m.  Wt Readings from Last 3 Encounters:  07/12/22 209 lb 12.8 oz (95.2 kg)  07/03/22 200 lb (90.7 kg)   07/02/22 200 lb (90.7 kg)    General: Patient appears comfortable at rest. HEENT: Conjunctiva and lids normal, oropharynx clear with moist mucosa. Neck: Supple, no elevated JVP or carotid bruits, no thyromegaly. Lungs: Clear to auscultation, nonlabored breathing at rest. Cardiac: Regular rate and rhythm, no S3 or significant systolic murmur, no pericardial rub. Abdomen: Soft, nontender, no hepatomegaly, bowel sounds present, no guarding or rebound. Extremities: No pitting edema, distal pulses 2+. Skin: Warm and dry. Musculoskeletal: No kyphosis. Neuropsychiatric: Alert and oriented x3, affect grossly appropriate.  ECG: Normal sinus rhythm with no ST changes  Recent Labwork: 07/02/2022: ALT 21; AST 24; B Natriuretic Peptide 99.0 07/03/2022: BUN 32; Creatinine, Ser 1.71; Hemoglobin 13.7; Platelets 268; Potassium 3.8; Sodium 139  No results found for: "CHOL", "TRIG", "HDL", "CHOLHDL", "VLDL", "LDLCALC", "LDLDIRECT"  Other Studies Reviewed Today:   Assessment and Plan: Patient is a 70 year old M known to have CAD s/p PCI in 2000, 5V CABG (LIMA to LAD, SVG to OM 2, OM 3, D1, PDA) in 2013 with LVEF 45% and apical akinesis, HTN, DM2, CKD stage III was referred to cardiology clinic  #CAD s/p PCI in 2000, 5V CABG (LIMA to LAD, SVG to OM 2, OM 3, D1, PDA) in 2013 with LVEF 45% and apical akinesis, currently has atypical chest pain -Patient has atypical features of chest pain which is still concerning in the background of diabetes mellitus. I will obtain 2D echocardiogram with contrast for LVEF and RWMA assessment. He will also benefit from exercise Myoview to rule out any high risk ischemia findings. He has CKD and unless he has high risk findings, LHC would not be entertained.  I have acute pericarditis low on the differential however would not hurt to obtain ESR and CRP due to atypical /migratory nature of his chest pains. -Continue aspirin 81 mg once daily -Continue atorvastatin 80 mg nightly  and add Zetia 10 mg once daily -Continue metoprolol succinate 100 mg once daily -Continue benazepril 40 mg once daily -SL NTG 0.4 mg as needed -ER precautions for chest pain provided  # HLD, not at goal -LDL level is more than 100 per patient.  Goal LDL should be less than 70.  Continue atorvastatin 80 mg nightly and encouraged to be compliant (he missed a few doses here and there).  Add Zetia 10 mg once daily.  # HTN, controlled -Continue amlodipine 10 mg once daily -Continue metoprolol succinate 100 mg once daily -Continue benazepril 40 mg once daily -Discontinue HCTZ due to CKD  I have spent a total of 41 minutes with patient reviewing chart, EKGs, labs and examining patient as well as establishing an assessment and plan that was discussed with the patient.  > 50% of time was spent in direct patient care.     Medication Adjustments/Labs and Tests Ordered: Current medicines are reviewed at length with the patient today.  Concerns regarding medicines are outlined above.   Tests Ordered: Orders Placed This Encounter  Procedures   NM Myocar Multi W/Spect W/Wall Motion / EF   Sed Rate (ESR)   C-reactive protein   ECHOCARDIOGRAM COMPLETE    Medication Changes: Meds ordered this encounter  Medications   ezetimibe (ZETIA) 10 MG tablet    Sig: Take 1 tablet (10 mg total) by mouth daily.    Dispense:  90 tablet    Refill:  3   nitroGLYCERIN (NITROSTAT) 0.4 MG SL tablet    Sig: Place 1 tablet (0.4 mg total) under the tongue every 5 (five) minutes as needed for chest pain.    Dispense:  90 tablet    Refill:  3    Disposition:  Follow up  6 months or sooner based on results  Signed, Pepe Mineau Fidel Levy, MD, 07/12/2022 10:30 AM    [facility] [address] [place], [place] [address]

## 2022-07-13 ENCOUNTER — Other Ambulatory Visit (HOSPITAL_COMMUNITY)
Admission: RE | Admit: 2022-07-13 | Discharge: 2022-07-13 | Disposition: A | Discharge status: Home / Self Care | Payer: BC Managed Care – PPO | Source: Ambulatory Visit | Attending: Internal Medicine | Admitting: Internal Medicine

## 2022-07-13 DIAGNOSIS — I25119 Atherosclerotic heart disease of native coronary artery with unspecified angina pectoris: Secondary | ICD-10-CM | POA: Insufficient documentation

## 2022-07-13 LAB — C-REACTIVE PROTEIN: CRP: 1 mg/dL — ABNORMAL HIGH (ref <1.0)

## 2022-07-13 LAB — SEDIMENTATION RATE: Sed Rate: 6 mm/hr (ref 0–16)

## 2022-07-17 ENCOUNTER — Telehealth: Payer: Self-pay

## 2022-07-17 ENCOUNTER — Telehealth: Payer: Self-pay | Admitting: Internal Medicine

## 2022-07-17 NOTE — Telephone Encounter (Signed)
Patient is returning call to discuss lab results. 

## 2022-07-17 NOTE — Telephone Encounter (Signed)
Vishnu P Mallipeddi, MD 07/17/2022  8:17 AM EST     CRP upper end of normal and ESR normal. Do not think it is acute pericarditis. Continue current plan.   

## 2022-07-17 NOTE — Telephone Encounter (Signed)
-----  Message from Chalmers Guest, MD sent at 07/17/2022  8:17 AM EST ----- CRP upper end of normal and ESR normal. Do not think it is acute pericarditis. Continue current plan.

## 2022-07-17 NOTE — Telephone Encounter (Signed)
Patient notified and verbalized understanding. 

## 2022-07-20 ENCOUNTER — Ambulatory Visit (HOSPITAL_COMMUNITY)
Admission: RE | Admit: 2022-07-20 | Discharge: 2022-07-20 | Disposition: A | Discharge status: Home / Self Care | Payer: BC Managed Care – PPO | Source: Ambulatory Visit | Attending: Internal Medicine | Admitting: Internal Medicine

## 2022-07-20 ENCOUNTER — Other Ambulatory Visit: Payer: Self-pay | Admitting: Internal Medicine

## 2022-07-20 DIAGNOSIS — I25119 Atherosclerotic heart disease of native coronary artery with unspecified angina pectoris: Secondary | ICD-10-CM | POA: Insufficient documentation

## 2022-07-20 LAB — ECHOCARDIOGRAM COMPLETE
Area-P 1/2: 4.49 cm2
S' Lateral: 3.25 cm

## 2022-07-20 MED ORDER — PERFLUTREN LIPID MICROSPHERE
1.0000 mL | INTRAVENOUS | Status: AC | PRN
  Administered 2022-07-20: 4 mL via INTRAVENOUS

## 2022-07-20 MED ORDER — APIXABAN 5 MG PO TABS: 5.0000 mg | ORAL_TABLET | Freq: Two times a day (BID) | ORAL | 2 refills | Status: DC

## 2022-07-20 NOTE — Progress Notes (Addendum)
*  PRELIMINARY RESULTS* Echocardiogram 2D Echocardiogram has been performed with Definity. Physician informed of concerns. She will call patient at a later time.  Martin Hooper 07/20/2022, 3:01 PM

## 2022-07-23 ENCOUNTER — Telehealth: Payer: Self-pay | Admitting: Internal Medicine

## 2022-07-23 ENCOUNTER — Other Ambulatory Visit: Payer: Self-pay

## 2022-07-23 DIAGNOSIS — I513 Intracardiac thrombosis, not elsewhere classified: Secondary | ICD-10-CM

## 2022-07-23 DIAGNOSIS — I25119 Atherosclerotic heart disease of native coronary artery with unspecified angina pectoris: Secondary | ICD-10-CM

## 2022-07-23 NOTE — Telephone Encounter (Signed)
Pt has some questions regarding his echo results   Please call [phone]

## 2022-07-23 NOTE — Telephone Encounter (Signed)
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 Ordered Eliquis 5 mg twice daily.  Follow-up in 6 months. Limited echo with contrast prior to the next clinic visit.  Pt had questions: Can he work? What is his prognosis for LV Thrombus? Needs letter for clearance to continue seeing Dentist.

## 2022-07-24 ENCOUNTER — Telehealth: Payer: Self-pay | Admitting: Internal Medicine

## 2022-07-24 NOTE — Telephone Encounter (Signed)
Pt c/o of Chest Pain: STAT if CP now or developed within 24 hours  1. Are you having CP right now? No  2. Are you experiencing any other symptoms (ex. SOB, nausea, vomiting, sweating)? No  3. How long have you been experiencing CP?  3 weeks 4. Is your CP continuous or coming and going?  Come and go 5. Have you taken Nitroglycerin? Yes - 1 ?   Pt states that he does have a clot. He would like to see about speaking to provider regarding Short Term Disability due to CP's causing him to miss a lot of work   Call transferred

## 2022-07-24 NOTE — Telephone Encounter (Signed)
Pt stated that he had an episode of CP this morning getting ready to go to work. Pt took SL Nitro. Pt stated that he is still having CP, but not as bad as earlier.  Please advise.   Pt also stated he would like to go on Short Term Disability if he continues to miss work d/t CP.   Please advise.

## 2022-07-26 NOTE — Telephone Encounter (Signed)
Patient notified and verbalized understanding. Patient had no questions or concerns at this time.  

## 2022-07-26 NOTE — Telephone Encounter (Signed)
Patient was notified on 1/9 and verbalized understanding.

## 2022-07-27 ENCOUNTER — Ambulatory Visit (HOSPITAL_COMMUNITY): Admission: RE | Admit: 2022-07-27 | Payer: BC Managed Care – PPO | Source: Ambulatory Visit

## 2022-07-27 ENCOUNTER — Encounter (HOSPITAL_COMMUNITY): Payer: Self-pay

## 2022-07-27 ENCOUNTER — Encounter (HOSPITAL_COMMUNITY)
Admission: RE | Admit: 2022-07-27 | Discharge: 2022-07-27 | Disposition: A | Discharge status: Home / Self Care | Payer: BC Managed Care – PPO | Source: Ambulatory Visit | Attending: Internal Medicine | Admitting: Internal Medicine

## 2022-07-27 DIAGNOSIS — I25119 Atherosclerotic heart disease of native coronary artery with unspecified angina pectoris: Secondary | ICD-10-CM

## 2022-08-06 ENCOUNTER — Encounter (HOSPITAL_COMMUNITY)
Admission: RE | Admit: 2022-08-06 | Discharge: 2022-08-06 | Disposition: A | Discharge status: Home / Self Care | Payer: BC Managed Care – PPO | Source: Ambulatory Visit | Attending: Internal Medicine | Admitting: Internal Medicine

## 2022-08-06 ENCOUNTER — Ambulatory Visit (HOSPITAL_COMMUNITY)
Admission: RE | Admit: 2022-08-06 | Discharge: 2022-08-06 | Disposition: A | Discharge status: Home / Self Care | Payer: BC Managed Care – PPO | Source: Ambulatory Visit | Attending: Internal Medicine | Admitting: Internal Medicine

## 2022-08-06 DIAGNOSIS — I25119 Atherosclerotic heart disease of native coronary artery with unspecified angina pectoris: Secondary | ICD-10-CM | POA: Insufficient documentation

## 2022-08-06 LAB — NM MYOCAR MULTI W/SPECT W/WALL MOTION / EF
LV dias vol: 129 mL (ref 62–150)
LV sys vol: 68 mL
Nuc Stress EF: 47 %
Peak HR: 85 {beats}/min
RATE: 0.6
Rest HR: 54 {beats}/min
Rest Nuclear Isotope Dose: 10.2 mCi
SDS: 8
SRS: 12
SSS: 20
ST Depression (mm): 0 mm
Stress Nuclear Isotope Dose: 31.2 mCi
TID: 0.98

## 2022-08-06 MED ORDER — TECHNETIUM TC 99M TETROFOSMIN IV KIT
30.0000 | PACK | Freq: Once | INTRAVENOUS | Status: AC | PRN
  Administered 2022-08-06: 31.2 via INTRAVENOUS

## 2022-08-06 MED ORDER — SODIUM CHLORIDE FLUSH 0.9 % IV SOLN
INTRAVENOUS | Status: AC
  Administered 2022-08-06: 10 mL via INTRAVENOUS
  Filled 2022-08-06: qty 10

## 2022-08-06 MED ORDER — REGADENOSON 0.4 MG/5ML IV SOLN
INTRAVENOUS | Status: AC
  Administered 2022-08-06: 0.4 mg via INTRAVENOUS
  Filled 2022-08-06: qty 5

## 2022-08-06 MED ORDER — TECHNETIUM TC 99M TETROFOSMIN IV KIT
10.0000 | PACK | Freq: Once | INTRAVENOUS | Status: AC | PRN
  Administered 2022-08-06: 10.2 via INTRAVENOUS

## 2022-08-07 ENCOUNTER — Telehealth: Payer: Self-pay | Admitting: Internal Medicine

## 2022-08-07 NOTE — Telephone Encounter (Signed)
Pt calling to f/u on test results. Please advise. 

## 2022-08-07 NOTE — Telephone Encounter (Signed)
Nuc stress test done yesterday patient calling for results. I will forward to Dr.Mallipeddi

## 2022-08-07 NOTE — Telephone Encounter (Signed)
Patient notified and verbalized understanding. Patient scheduled for 1/30 @ 8 am with VM in the [place] office.

## 2022-08-08 ENCOUNTER — Telehealth: Payer: Self-pay | Admitting: Cardiology

## 2022-08-09 ENCOUNTER — Telehealth: Payer: Self-pay

## 2022-08-09 DIAGNOSIS — I25119 Atherosclerotic heart disease of native coronary artery with unspecified angina pectoris: Secondary | ICD-10-CM

## 2022-08-09 NOTE — Telephone Encounter (Signed)
Patient notified and verbalized understanding. PCP copied.   

## 2022-08-09 NOTE — Telephone Encounter (Signed)
-----  Message from Chalmers Guest, MD sent at 08/08/2022  2:17 PM EST ----- Stress test is abnormal. He is scheduled to see me on 08/14/2022 to discuss the same.  Please obtain CBC and CMP prior to the next clinic visit.

## 2022-08-10 ENCOUNTER — Other Ambulatory Visit (HOSPITAL_COMMUNITY)
Admission: RE | Admit: 2022-08-10 | Discharge: 2022-08-10 | Disposition: A | Discharge status: Home / Self Care | Payer: BC Managed Care – PPO | Source: Ambulatory Visit | Attending: Internal Medicine | Admitting: Internal Medicine

## 2022-08-10 DIAGNOSIS — I25119 Atherosclerotic heart disease of native coronary artery with unspecified angina pectoris: Secondary | ICD-10-CM | POA: Diagnosis present

## 2022-08-10 LAB — COMPREHENSIVE METABOLIC PANEL
ALT: 23 U/L (ref 0–44)
AST: 21 U/L (ref 15–41)
Albumin: 3.7 g/dL (ref 3.5–5.0)
Alkaline Phosphatase: 47 U/L (ref 38–126)
Anion gap: 7 (ref 5–15)
BUN: 29 mg/dL — ABNORMAL HIGH (ref 8–23)
CO2: 29 mmol/L (ref 22–32)
Calcium: 8.9 mg/dL (ref 8.9–10.3)
Chloride: 102 mmol/L (ref 98–111)
Creatinine, Ser: 1.54 mg/dL — ABNORMAL HIGH (ref 0.61–1.24)
GFR, Estimated: 48 mL/min — ABNORMAL LOW (ref >60)
Glucose, Bld: 162 mg/dL — ABNORMAL HIGH (ref 70–99)
Potassium: 4.2 mmol/L (ref 3.5–5.1)
Sodium: 138 mmol/L (ref 135–145)
Total Bilirubin: 0.8 mg/dL (ref 0.3–1.2)
Total Protein: 6.7 g/dL (ref 6.5–8.1)

## 2022-08-10 LAB — CBC
HCT: 39 % (ref 39.0–52.0)
Hemoglobin: 13.1 g/dL (ref 13.0–17.0)
MCH: 29.6 pg (ref 26.0–34.0)
MCHC: 33.6 g/dL (ref 30.0–36.0)
MCV: 88.2 fL (ref 80.0–100.0)
Platelets: 252 10*3/uL (ref 150–400)
RBC: 4.42 MIL/uL (ref 4.22–5.81)
RDW: 12.7 % (ref 11.5–15.5)
WBC: 10.3 10*3/uL (ref 4.0–10.5)
nRBC: 0 % (ref 0.0–0.2)

## 2022-08-14 ENCOUNTER — Encounter: Payer: Self-pay | Admitting: Internal Medicine

## 2022-08-14 ENCOUNTER — Other Ambulatory Visit (HOSPITAL_COMMUNITY)
Admission: RE | Admit: 2022-08-14 | Discharge: 2022-08-14 | Disposition: A | Discharge status: Home / Self Care | Payer: BC Managed Care – PPO | Source: Home / Self Care | Attending: Internal Medicine | Admitting: Internal Medicine

## 2022-08-14 ENCOUNTER — Ambulatory Visit: Payer: BC Managed Care – PPO | Attending: Internal Medicine | Admitting: Internal Medicine

## 2022-08-14 VITALS — BP 122/68 | HR 65 | Ht 68.5 in | Wt 208.2 lb

## 2022-08-14 DIAGNOSIS — Z01818 Encounter for other preprocedural examination: Secondary | ICD-10-CM | POA: Insufficient documentation

## 2022-08-14 DIAGNOSIS — E785 Hyperlipidemia, unspecified: Secondary | ICD-10-CM | POA: Diagnosis not present

## 2022-08-14 DIAGNOSIS — I513 Intracardiac thrombosis, not elsewhere classified: Secondary | ICD-10-CM | POA: Diagnosis not present

## 2022-08-14 DIAGNOSIS — I25708 Atherosclerosis of coronary artery bypass graft(s), unspecified, with other forms of angina pectoris: Secondary | ICD-10-CM | POA: Diagnosis not present

## 2022-08-14 DIAGNOSIS — I252 Old myocardial infarction: Secondary | ICD-10-CM | POA: Diagnosis not present

## 2022-08-14 DIAGNOSIS — I129 Hypertensive chronic kidney disease with stage 1 through stage 4 chronic kidney disease, or unspecified chronic kidney disease: Secondary | ICD-10-CM | POA: Diagnosis not present

## 2022-08-14 DIAGNOSIS — I2582 Chronic total occlusion of coronary artery: Secondary | ICD-10-CM | POA: Diagnosis not present

## 2022-08-14 DIAGNOSIS — Z7901 Long term (current) use of anticoagulants: Secondary | ICD-10-CM | POA: Diagnosis not present

## 2022-08-14 DIAGNOSIS — E1122 Type 2 diabetes mellitus with diabetic chronic kidney disease: Secondary | ICD-10-CM | POA: Diagnosis not present

## 2022-08-14 DIAGNOSIS — Z7984 Long term (current) use of oral hypoglycemic drugs: Secondary | ICD-10-CM | POA: Diagnosis not present

## 2022-08-14 DIAGNOSIS — Z794 Long term (current) use of insulin: Secondary | ICD-10-CM | POA: Diagnosis not present

## 2022-08-14 DIAGNOSIS — R9439 Abnormal result of other cardiovascular function study: Secondary | ICD-10-CM | POA: Diagnosis present

## 2022-08-14 DIAGNOSIS — Z79899 Other long term (current) drug therapy: Secondary | ICD-10-CM | POA: Diagnosis not present

## 2022-08-14 DIAGNOSIS — N183 Chronic kidney disease, stage 3 unspecified: Secondary | ICD-10-CM | POA: Diagnosis not present

## 2022-08-14 LAB — BASIC METABOLIC PANEL
Anion gap: 9 (ref 5–15)
BUN: 26 mg/dL — ABNORMAL HIGH (ref 8–23)
CO2: 26 mmol/L (ref 22–32)
Calcium: 9.1 mg/dL (ref 8.9–10.3)
Chloride: 100 mmol/L (ref 98–111)
Creatinine, Ser: 1.59 mg/dL — ABNORMAL HIGH (ref 0.61–1.24)
GFR, Estimated: 46 mL/min — ABNORMAL LOW (ref >60)
Glucose, Bld: 251 mg/dL — ABNORMAL HIGH (ref 70–99)
Potassium: 3.8 mmol/L (ref 3.5–5.1)
Sodium: 135 mmol/L (ref 135–145)

## 2022-08-14 MED ORDER — AMLODIPINE BESYLATE 5 MG PO TABS: 5.0000 mg | ORAL_TABLET | Freq: Every day | ORAL | 3 refills | Status: DC

## 2022-08-14 MED ORDER — ISOSORBIDE MONONITRATE ER 30 MG PO TB24: 30.0000 mg | ORAL_TABLET | Freq: Every day | ORAL | 3 refills | Status: DC

## 2022-08-14 NOTE — Patient Instructions (Addendum)
Medication Instructions:  Your physician has recommended you make the following change in your medication:  -Stop Zetia -Decrease Amlodipine to 5 mg tablets daily -Start Imdur 30 mg tablets daily.  HOLD ELIQUIS 69 HOURS PRIOR TO YOUR HEART CATH.   Labwork: BMET- Today  Testing/Procedures: Left Heart Cath- Please see attached instruction sheet  Follow-Up: Follow up with Dr. Dellia Cloud in 1 month.   Any Other Special Instructions Will Be Listed Below (If Applicable).  Your physician has requested that you regularly monitor and record your blood pressure readings at home. Please use the same machine at the same time of day to check your readings and record them to bring to your follow-up visit.    If you need a refill on your cardiac medications before your next appointment, please call your pharmacy.

## 2022-08-15 ENCOUNTER — Telehealth: Payer: Self-pay | Admitting: *Deleted

## 2022-08-15 DIAGNOSIS — I513 Intracardiac thrombosis, not elsewhere classified: Secondary | ICD-10-CM | POA: Insufficient documentation

## 2022-08-15 NOTE — Telephone Encounter (Signed)
Cardiac Catheterization scheduled at [facility] for: Thursday August 16, 2022 12 Noon Arrival time and place: [facility] Main Entrance A at: 7 AM -pre-procedure hydration  Nothing to eat after midnight prior to procedure, clear liquids until 5 AM day of procedure.  Medication instructions: -Hold:  Eliquis-last dose AM 08/14/22 until post procedure  HCTZ-Lotensin-day before and day of procedure-per protocol GFR 46 -pt already taken today  Metformin-day of procedure and 48 hours post procedure  Tresiba-1/2 usual dose HS prior to procedure -Except hold medications usual morning medications can be taken with sips of water including aspirin 81 mg.  Confirmed patient has responsible adult to drive home post procedure and be with patient first 24 hours after arriving home.  Patient reports no new symptoms concerning for COVID-19 in the past 10 days.  Reviewed procedure instructions/pre-procedure hydration with patient.

## 2022-08-15 NOTE — H&P (View-Only) (Signed)
Cardiology Office Note  Date: 08/15/2022   ID: Martin, Hooper 14-Nov-1950, MRN [id number]  PCP:  Martin Bis, MD  Cardiologist:  None Electrophysiologist:  None   Reason for Office Visit: Follow-up of abnormal stress test results   History of Present Illness: Martin Hooper is a 72 y.o. male known to have CAD s/p PCI in 2000, 5V CABG (LIMA to LAD, SVG to OM 2, OM 3, D1, PDA) in 2013 with LVEF 45% and apical akinesis, HTN, DM2, CKD stage III presented to cardiology clinic for abnormal stress test results.  Patient was following with [facility] until 2013 and was lost to follow-up. He presented to the ER in 12/23 with migratory chest pains and was referred to outpatient cardiology to establish care. Echocardiogram was performed in 1/24 that showed LVEF 55 to 60% with apical akinesis/aneurysmal segment and evidence of 1 cm mural thrombus for which Eliquis 5 mg twice daily was started and aspirin 81 mg was discontinued. Patient preferred to be on Eliquis and not on Coumadin due to frequent INR checks. Nuclear stress test was also performed in 07/2022 that showed large moderate to severe intensity inferior defect with moderate to high level of reversibility and large severe intensity fixed apical defect.  Nuclear study is intermediate to high risk based on the degree of inferior peri-infarct ischemia and decreased LVEF (nuclear LVEF was 47%).  Patient is here for follow-up visit.  He continues to have migratory chest pains initially less frequent occurring 2 times per week but happening daily and symptoms even woke him up from sleep.  Usually last for 30 minutes to 1 hour and resolves with SL NTG. He also reports worsening chest pains with exposure to cold weather/after eating food and resolves with rest. Denies any SOB, dizziness, lightheadedness, syncope, leg swelling. Former smoker, quit in 1990s, denied alcohol use and illicit drug abuse.  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 90 tablet 3   apixaban (ELIQUIS) 5 MG TABS tablet Take 1 tablet (5 mg total) by mouth 2 (two) times daily. (Patient not taking: Reported on 08/14/2022) 180 tablet 2   Ascorbic Acid (VITAMIN C) 1000 MG tablet Take 1,000 mg by mouth daily.     atorvastatin (LIPITOR) 80 MG tablet Take 80 mg by mouth daily.     benazepril (LOTENSIN) 40 MG tablet Take 40 mg by mouth daily.     Cholecalciferol 125 MCG (5000 UT) TABS Take 5,000 Units by mouth daily.     esomeprazole (NEXIUM) 20 MG capsule Take 20 mg by mouth daily.     ferrous sulfate 325 (65 FE) MG tablet Take 325 mg by mouth daily with breakfast.     isosorbide mononitrate (IMDUR) 30 MG 24 hr tablet Take 1 tablet (30 mg total) by mouth daily. 90 tablet 3   metFORMIN (GLUCOPHAGE-XR) 500 MG 24 hr tablet Take 500-1,000 mg by mouth See admin instructions. Take 1000mg  in the AM and  500mg in the PM.     metoprolol succinate (TOPROL-XL) 100 MG 24 hr tablet Take 100 mg by mouth daily.     nitroGLYCERIN (NITROSTAT) 0.4 MG SL tablet Place 1 tablet (0.4 mg total) under the tongue every 5 (five) minutes as needed for chest pain. 90 tablet 3   tamsulosin (FLOMAX) 0.4 MG CAPS capsule Take 0.4 mg by mouth in the morning and at bedtime.     TRESIBA FLEXTOUCH 200 UNIT/ML FlexTouch Pen Inject 68 Units into the skin at bedtime.     Zinc 30 MG CAPS Take 30 mg by mouth daily.     Coenzyme Q10 100 MG TABS Take 100 mg by mouth daily.     diphenhydrAMINE (BENADRYL) 25 MG tablet Take 25 mg by mouth at bedtime.     hydrochlorothiazide (HYDRODIURIL) 25 MG tablet Take 25  mg by mouth daily.     Omega-3 Fatty Acids (FISH OIL) 1200 MG CAPS Take 1,200 mg by mouth daily.     tiZANidine (ZANAFLEX) 4 MG tablet Take 2 mg by mouth every 6 (six) hours as needed for muscle spasms.     No current facility-administered medications for this visit.   Allergies:  Alpha-gal, Beef (bovine) protein, and Pork-derived products   Social History: The patient  reports that he has never smoked. He has never used smokeless tobacco. He reports that he does not drink alcohol and does not use drugs.   Family History: The patient's family history includes Cancer in his father; Diabetes in his mother; Schizophrenia in his mother.   ROS:  Please see the history of present illness. Otherwise, complete review of systems is positive for none.  All other systems are reviewed and negative.   Physical Exam: VS:  BP 122/68   Pulse 65   Ht 5' 8.5" (1.74 m)   Wt 208 lb 3.2 oz (94.4 kg)   SpO2 98%   BMI 31.20 kg/m , BMI Body mass index is 31.2 kg/m.  Wt Readings from Last 3 Encounters:  08/14/22 208 lb 3.2 oz (94.4 kg)  07/12/22 209 lb 12.8 oz (95.2 kg)  07/03/22 200 lb (90.7 kg)    General: Patient appears comfortable at rest. HEENT: Conjunctiva and lids normal, oropharynx clear with moist mucosa. Neck: Supple, no elevated JVP or carotid bruits, no thyromegaly. Lungs: Clear to auscultation, nonlabored breathing at rest. Cardiac: Regular rate and rhythm, no S3 or significant systolic murmur, no pericardial rub. Abdomen: Soft, nontender, no hepatomegaly, bowel sounds present, no guarding or rebound. Extremities: No pitting edema, distal pulses 2+. Skin: Warm and dry. Musculoskeletal: No kyphosis. Neuropsychiatric: Alert and oriented x3, affect grossly appropriate.  ECG: Normal sinus rhythm with no ST changes  Recent Labwork: 07/02/2022: B Natriuretic Peptide 99.0 08/10/2022: ALT 23; AST 21; Hemoglobin 13.1; Platelets 252 08/14/2022: BUN 26; Creatinine, Ser 1.59; Potassium 3.8;  Sodium 135  No results found for: "CHOL", "TRIG", "HDL", "CHOLHDL", "VLDL", "LDLCALC", "LDLDIRECT"  Other Studies Reviewed Today:   Assessment and Plan: Patient is a 71-year-old M known to have CAD s/p PCI in 2000, 5V CABG (LIMA to LAD, SVG to OM 2, OM 3, D1, PDA) in 2013 with LVEF 45% and apical akinesis, HTN, DM2, CKD stage III was referred to cardiology clinic  #CAD s/p PCI in 2000, 5V CABG (LIMA to LAD, SVG to OM 2, OM 3, D1, PDA) in 2013 with LVEF 55%, apical akinesis and 1 cm LV thrombus in 1/24, currently has atypical features of angina. -Patient started to have   migratory types of chest pain since mid December 2023 initially less frequent (occurring 2 times per week) and gradually progressed to daily chest pains now lasting for 30 minutes to 1 hour. Resolved with SL NTG. Chest pains woke him up from sleep as well 1-2 times so far.  Nuclear stress test from 07/2022 showed large moderate to severe intensity inferior defect with moderate to high level of reversibility and large severe intensity fixed apical defect.  Nuclear study is intermediate to high risk based on the degree of inferior peri-infarct ischemia and decreased LVEF (nuclear LVEF was 47%).  Due to intermediate to high risk nuclear stress test and new onset of atypical chest pains resolving with SL NTG in the setting of diabetes mellitus type 2, patient will benefit from [facility]. Risks and benefits of cardiac catheterization have been discussed with the patient.  These include bleeding, infection, kidney damage, stroke, heart attack, death.  The patient understands these risks and is willing to proceed. -Patient is currently on Eliquis 5 mg twice daily for treatment of LV thrombus. Eliquis will need to to be held 48 hours prior to [facility]. He also has CKD for which pre and post procedure hydration is needed. No contrast allergy. -Continue atorvastatin 80 mg nightly and discontinue Zetia due to diarrhea side effect. -Continue metoprolol succinate  100 mg once daily, start Imdur 30 mg once daily and decrease the dose of amlodipine from 10 mg to 5 mg once daily. -SL NTG 0.4 mg as needed -ER precautions for chest pain  # HLD, not at goal (LDL levels around 100 per patient) -Continue atorvastatin 80 mg nightly and discontinue Zetia due to diarrhea side effect.  If his lipid panel continues to show LDL more than 70, he will benefit from PCSK9 inhibitors.  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 Patient preferred DOACs and not Coumadin due to frequent INR checks. Will repeat limited echocardiogram with contrast in 6 months for LV thrombus evaluation.  # HTN, controlled -Decrease amlodipine from 10 mg to 5 mg once daily and start Imdur 30 mg once daily -Continue metoprolol succinate 100 mg once daily -Continue benazepril 40 mg once daily -He was previously on HCTZ and was discontinued due to CKD  I have spent a total of 33 minutes with patient reviewing chart, EKGs, labs and examining patient as well as establishing an assessment and plan that was discussed with the patient.  > 50% of time was spent in direct patient care.     Medication Adjustments/Labs and Tests Ordered: Current medicines are reviewed at length with the patient today.  Concerns regarding medicines are outlined above.   Tests Ordered: Orders Placed This Encounter  Procedures   Basic metabolic panel    Medication Changes: Meds ordered this encounter  Medications   amLODipine (NORVASC) 5 MG tablet    Sig: Take 1 tablet (5 mg total) by mouth daily.    Dispense:  90 tablet    Refill:  3   isosorbide mononitrate (IMDUR) 30 MG 24 hr tablet    Sig: Take 1 tablet (30 mg total) by mouth daily.    Dispense:  90 tablet    Refill:  3    Disposition:  Follow up  1 month post LHC  Signed, Rosana Farnell Fidel Levy, MD, 08/15/2022 8:37 AM    [facility]. [address] [place], [place] [address]

## 2022-08-15 NOTE — Progress Notes (Signed)
Cardiology Office Note  Date: 08/15/2022   ID: Martin Hooper, Martin Hooper 14-Nov-1950, MRN [id number]  PCP:  Caryl Bis, MD  Cardiologist:  None Electrophysiologist:  None   Reason for Office Visit: Follow-up of abnormal stress test results   History of Present Illness: Martin Hooper is a 72 y.o. male known to have CAD s/p PCI in 2000, 5V CABG (LIMA to LAD, SVG to OM 2, OM 3, D1, PDA) in 2013 with LVEF 45% and apical akinesis, HTN, DM2, CKD stage III presented to cardiology clinic for abnormal stress test results.  Patient was following with [facility] until 2013 and was lost to follow-up. He presented to the ER in 12/23 with migratory chest pains and was referred to outpatient cardiology to establish care. Echocardiogram was performed in 1/24 that showed LVEF 55 to 60% with apical akinesis/aneurysmal segment and evidence of 1 cm mural thrombus for which Eliquis 5 mg twice daily was started and aspirin 81 mg was discontinued. Patient preferred to be on Eliquis and not on Coumadin due to frequent INR checks. Nuclear stress test was also performed in 07/2022 that showed large moderate to severe intensity inferior defect with moderate to high level of reversibility and large severe intensity fixed apical defect.  Nuclear study is intermediate to high risk based on the degree of inferior peri-infarct ischemia and decreased LVEF (nuclear LVEF was 47%).  Patient is here for follow-up visit.  He continues to have migratory chest pains initially less frequent occurring 2 times per week but happening daily and symptoms even woke him up from sleep.  Usually last for 30 minutes to 1 hour and resolves with SL NTG. He also reports worsening chest pains with exposure to cold weather/after eating food and resolves with rest. Denies any SOB, dizziness, lightheadedness, syncope, leg swelling. Former smoker, quit in 1990s, denied alcohol use and illicit drug abuse.  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 90 tablet 3   apixaban (ELIQUIS) 5 MG TABS tablet Take 1 tablet (5 mg total) by mouth 2 (two) times daily. (Patient not taking: Reported on 08/14/2022) 180 tablet 2   Ascorbic Acid (VITAMIN C) 1000 MG tablet Take 1,000 mg by mouth daily.     atorvastatin (LIPITOR) 80 MG tablet Take 80 mg by mouth daily.     benazepril (LOTENSIN) 40 MG tablet Take 40 mg by mouth daily.     Cholecalciferol 125 MCG (5000 UT) TABS Take 5,000 Units by mouth daily.     esomeprazole (NEXIUM) 20 MG capsule Take 20 mg by mouth daily.     ferrous sulfate 325 (65 FE) MG tablet Take 325 mg by mouth daily with breakfast.     isosorbide mononitrate (IMDUR) 30 MG 24 hr tablet Take 1 tablet (30 mg total) by mouth daily. 90 tablet 3   metFORMIN (GLUCOPHAGE-XR) 500 MG 24 hr tablet Take 500-1,000 mg by mouth See admin instructions. Take 1000mg  in the AM and  500mg  in the PM.     metoprolol succinate (TOPROL-XL) 100 MG 24 hr tablet Take 100 mg by mouth daily.     nitroGLYCERIN (NITROSTAT) 0.4 MG SL tablet Place 1 tablet (0.4 mg total) under the tongue every 5 (five) minutes as needed for chest pain. 90 tablet 3   tamsulosin (FLOMAX) 0.4 MG CAPS capsule Take 0.4 mg by mouth in the morning and at bedtime.     TRESIBA FLEXTOUCH 200 UNIT/ML FlexTouch Pen Inject 68 Units into the skin at bedtime.     Zinc 30 MG CAPS Take 30 mg by mouth daily.     Coenzyme Q10 100 MG TABS Take 100 mg by mouth daily.     diphenhydrAMINE (BENADRYL) 25 MG tablet Take 25 mg by mouth at bedtime.     hydrochlorothiazide (HYDRODIURIL) 25 MG tablet Take 25  mg by mouth daily.     Omega-3 Fatty Acids (FISH OIL) 1200 MG CAPS Take 1,200 mg by mouth daily.     tiZANidine (ZANAFLEX) 4 MG tablet Take 2 mg by mouth every 6 (six) hours as needed for muscle spasms.     No current facility-administered medications for this visit.   Allergies:  Alpha-gal, Beef (bovine) protein, and Pork-derived products   Social History: The patient  reports that he has never smoked. He has never used smokeless tobacco. He reports that he does not drink alcohol and does not use drugs.   Family History: The patient's family history includes Cancer in his father; Diabetes in his mother; Schizophrenia in his mother.   ROS:  Please see the history of present illness. Otherwise, complete review of systems is positive for none.  All other systems are reviewed and negative.   Physical Exam: VS:  BP 122/68   Pulse 65   Ht 5' 8.5" (1.74 m)   Wt 208 lb 3.2 oz (94.4 kg)   SpO2 98%   BMI 31.20 kg/m , BMI Body mass index is 31.2 kg/m.  Wt Readings from Last 3 Encounters:  08/14/22 208 lb 3.2 oz (94.4 kg)  07/12/22 209 lb 12.8 oz (95.2 kg)  07/03/22 200 lb (90.7 kg)    General: Patient appears comfortable at rest. HEENT: Conjunctiva and lids normal, oropharynx clear with moist mucosa. Neck: Supple, no elevated JVP or carotid bruits, no thyromegaly. Lungs: Clear to auscultation, nonlabored breathing at rest. Cardiac: Regular rate and rhythm, no S3 or significant systolic murmur, no pericardial rub. Abdomen: Soft, nontender, no hepatomegaly, bowel sounds present, no guarding or rebound. Extremities: No pitting edema, distal pulses 2+. Skin: Warm and dry. Musculoskeletal: No kyphosis. Neuropsychiatric: Alert and oriented x3, affect grossly appropriate.  ECG: Normal sinus rhythm with no ST changes  Recent Labwork: 07/02/2022: B Natriuretic Peptide 99.0 08/10/2022: ALT 23; AST 21; Hemoglobin 13.1; Platelets 252 08/14/2022: BUN 26; Creatinine, Ser 1.59; Potassium 3.8;  Sodium 135  No results found for: "CHOL", "TRIG", "HDL", "CHOLHDL", "VLDL", "LDLCALC", "LDLDIRECT"  Other Studies Reviewed Today:   Assessment and Plan: Patient is a 72 year old M known to have CAD s/p PCI in 2000, 5V CABG (LIMA to LAD, SVG to OM 2, OM 3, D1, PDA) in 2013 with LVEF 45% and apical akinesis, HTN, DM2, CKD stage III was referred to cardiology clinic  #CAD s/p PCI in 2000, 5V CABG (LIMA to LAD, SVG to OM 2, OM 3, D1, PDA) in 2013 with LVEF 55%, apical akinesis and 1 cm LV thrombus in 1/24, currently has atypical features of angina. -Patient started to have  migratory types of chest pain since mid December 2023 initially less frequent (occurring 2 times per week) and gradually progressed to daily chest pains now lasting for 30 minutes to 1 hour. Resolved with SL NTG. Chest pains woke him up from sleep as well 1-2 times so far.  Nuclear stress test from 07/2022 showed large moderate to severe intensity inferior defect with moderate to high level of reversibility and large severe intensity fixed apical defect.  Nuclear study is intermediate to high risk based on the degree of inferior peri-infarct ischemia and decreased LVEF (nuclear LVEF was 47%).  Due to intermediate to high risk nuclear stress test and new onset of atypical chest pains resolving with SL NTG in the setting of diabetes mellitus type 2, patient will benefit from [facility]. Risks and benefits of cardiac catheterization have been discussed with the patient.  These include bleeding, infection, kidney damage, stroke, heart attack, death.  The patient understands these risks and is willing to proceed. -Patient is currently on Eliquis 5 mg twice daily for treatment of LV thrombus. Eliquis will need to to be held 48 hours prior to [facility]. He also has CKD for which pre and post procedure hydration is needed. No contrast allergy. -Continue atorvastatin 80 mg nightly and discontinue Zetia due to diarrhea side effect. -Continue metoprolol succinate  100 mg once daily, start Imdur 30 mg once daily and decrease the dose of amlodipine from 10 mg to 5 mg once daily. -SL NTG 0.4 mg as needed -ER precautions for chest pain  # HLD, not at goal (LDL levels around 100 per patient) -Continue atorvastatin 80 mg nightly and discontinue Zetia due to diarrhea side effect.  If his lipid panel continues to show LDL more than 70, he will benefit from PCSK9 inhibitors.  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 Patient preferred DOACs and not Coumadin due to frequent INR checks. Will repeat limited echocardiogram with contrast in 6 months for LV thrombus evaluation.  # HTN, controlled -Decrease amlodipine from 10 mg to 5 mg once daily and start Imdur 30 mg once daily -Continue metoprolol succinate 100 mg once daily -Continue benazepril 40 mg once daily -He was previously on HCTZ and was discontinued due to CKD  I have spent a total of 33 minutes with patient reviewing chart, EKGs, labs and examining patient as well as establishing an assessment and plan that was discussed with the patient.  > 50% of time was spent in direct patient care.     Medication Adjustments/Labs and Tests Ordered: Current medicines are reviewed at length with the patient today.  Concerns regarding medicines are outlined above.   Tests Ordered: Orders Placed This Encounter  Procedures   Basic metabolic panel    Medication Changes: Meds ordered this encounter  Medications   amLODipine (NORVASC) 5 MG tablet    Sig: Take 1 tablet (5 mg total) by mouth daily.    Dispense:  90 tablet    Refill:  3   isosorbide mononitrate (IMDUR) 30 MG 24 hr tablet    Sig: Take 1 tablet (30 mg total) by mouth daily.    Dispense:  90 tablet    Refill:  3    Disposition:  Follow up  1 month post [facility]  Signed, Isais Klipfel Fidel Levy, MD, 08/15/2022 8:37 AM    [facility]. [address] [place], [place] [address]

## 2022-08-16 ENCOUNTER — Encounter (HOSPITAL_COMMUNITY)
Admission: RE | Disposition: A | Discharge status: Home / Self Care | Payer: Self-pay | Source: Ambulatory Visit | Attending: Cardiovascular Disease

## 2022-08-16 ENCOUNTER — Ambulatory Visit (HOSPITAL_COMMUNITY)
Admission: RE | Admit: 2022-08-16 | Discharge: 2022-08-16 | Disposition: A | Discharge status: Home / Self Care | Payer: BC Managed Care – PPO | Source: Ambulatory Visit | Attending: Cardiovascular Disease | Admitting: Cardiovascular Disease

## 2022-08-16 ENCOUNTER — Encounter (HOSPITAL_COMMUNITY): Payer: Self-pay | Admitting: Cardiovascular Disease

## 2022-08-16 ENCOUNTER — Other Ambulatory Visit: Payer: Self-pay

## 2022-08-16 DIAGNOSIS — I25118 Atherosclerotic heart disease of native coronary artery with other forms of angina pectoris: Secondary | ICD-10-CM

## 2022-08-16 DIAGNOSIS — I25708 Atherosclerosis of coronary artery bypass graft(s), unspecified, with other forms of angina pectoris: Secondary | ICD-10-CM | POA: Insufficient documentation

## 2022-08-16 DIAGNOSIS — E785 Hyperlipidemia, unspecified: Secondary | ICD-10-CM | POA: Insufficient documentation

## 2022-08-16 DIAGNOSIS — Z79899 Other long term (current) drug therapy: Secondary | ICD-10-CM | POA: Insufficient documentation

## 2022-08-16 DIAGNOSIS — I513 Intracardiac thrombosis, not elsewhere classified: Secondary | ICD-10-CM | POA: Insufficient documentation

## 2022-08-16 DIAGNOSIS — I2582 Chronic total occlusion of coronary artery: Secondary | ICD-10-CM | POA: Insufficient documentation

## 2022-08-16 DIAGNOSIS — I129 Hypertensive chronic kidney disease with stage 1 through stage 4 chronic kidney disease, or unspecified chronic kidney disease: Secondary | ICD-10-CM | POA: Insufficient documentation

## 2022-08-16 DIAGNOSIS — Z794 Long term (current) use of insulin: Secondary | ICD-10-CM | POA: Insufficient documentation

## 2022-08-16 DIAGNOSIS — Z7984 Long term (current) use of oral hypoglycemic drugs: Secondary | ICD-10-CM | POA: Insufficient documentation

## 2022-08-16 DIAGNOSIS — E1122 Type 2 diabetes mellitus with diabetic chronic kidney disease: Secondary | ICD-10-CM | POA: Insufficient documentation

## 2022-08-16 DIAGNOSIS — N183 Chronic kidney disease, stage 3 unspecified: Secondary | ICD-10-CM | POA: Insufficient documentation

## 2022-08-16 DIAGNOSIS — I252 Old myocardial infarction: Secondary | ICD-10-CM | POA: Insufficient documentation

## 2022-08-16 DIAGNOSIS — Z7901 Long term (current) use of anticoagulants: Secondary | ICD-10-CM | POA: Insufficient documentation

## 2022-08-16 HISTORY — PX: CORONARY/GRAFT ANGIOGRAPHY: CATH118237

## 2022-08-16 LAB — GLUCOSE, CAPILLARY
Glucose-Capillary: 183 mg/dL — ABNORMAL HIGH (ref 70–99)
Glucose-Capillary: 127 mg/dL — ABNORMAL HIGH (ref 70–99)

## 2022-08-16 SURGERY — CORONARY/GRAFT ANGIOGRAPHY
Anesthesia: LOCAL

## 2022-08-16 MED ORDER — SODIUM CHLORIDE 0.9 % IV SOLN: 250.0000 mL | INTRAVENOUS | Status: DC | PRN

## 2022-08-16 MED ORDER — MIDAZOLAM HCL 2 MG/2ML IJ SOLN
INTRAMUSCULAR | Status: DC | PRN
  Administered 2022-08-16: 2 mg via INTRAVENOUS

## 2022-08-16 MED ORDER — SODIUM CHLORIDE 0.9 % IV SOLN: INTRAVENOUS | Status: AC

## 2022-08-16 MED ORDER — ONDANSETRON HCL 4 MG/2ML IJ SOLN: 4.0000 mg | Freq: Four times a day (QID) | INTRAMUSCULAR | Status: DC | PRN

## 2022-08-16 MED ORDER — LIDOCAINE HCL (PF) 1 % IJ SOLN
INTRAMUSCULAR | Status: AC
  Filled 2022-08-16: qty 30

## 2022-08-16 MED ORDER — SODIUM CHLORIDE 0.9 % WEIGHT BASED INFUSION: 1.0000 mL/kg/h | INTRAVENOUS | Status: DC

## 2022-08-16 MED ORDER — HEPARIN SODIUM (PORCINE) 1000 UNIT/ML IJ SOLN
INTRAMUSCULAR | Status: AC
  Filled 2022-08-16: qty 10

## 2022-08-16 MED ORDER — HEPARIN SODIUM (PORCINE) 1000 UNIT/ML IJ SOLN
INTRAMUSCULAR | Status: DC | PRN
  Administered 2022-08-16: 5000 [IU] via INTRAVENOUS

## 2022-08-16 MED ORDER — ASPIRIN 81 MG PO CHEW: 81.0000 mg | CHEWABLE_TABLET | ORAL | Status: DC

## 2022-08-16 MED ORDER — HYDRALAZINE HCL 20 MG/ML IJ SOLN: 10.0000 mg | INTRAMUSCULAR | Status: DC | PRN

## 2022-08-16 MED ORDER — LABETALOL HCL 5 MG/ML IV SOLN: 10.0000 mg | INTRAVENOUS | Status: DC | PRN

## 2022-08-16 MED ORDER — MIDAZOLAM HCL 2 MG/2ML IJ SOLN
INTRAMUSCULAR | Status: AC
  Filled 2022-08-16: qty 2

## 2022-08-16 MED ORDER — ACETAMINOPHEN 325 MG PO TABS: 650.0000 mg | ORAL_TABLET | ORAL | Status: DC | PRN

## 2022-08-16 MED ORDER — VERAPAMIL HCL 2.5 MG/ML IV SOLN
INTRAVENOUS | Status: DC | PRN
  Administered 2022-08-16: 10 mL via INTRA_ARTERIAL

## 2022-08-16 MED ORDER — IOHEXOL 350 MG/ML SOLN
INTRAVENOUS | Status: DC | PRN
  Administered 2022-08-16: 80 mL

## 2022-08-16 MED ORDER — SODIUM CHLORIDE 0.9 % WEIGHT BASED INFUSION
3.0000 mL/kg/h | INTRAVENOUS | Status: AC
  Administered 2022-08-16: 3 mL/kg/h via INTRAVENOUS

## 2022-08-16 MED ORDER — HEPARIN (PORCINE) IN NACL 1000-0.9 UT/500ML-% IV SOLN
INTRAVENOUS | Status: DC | PRN
  Administered 2022-08-16 (×2): 500 mL

## 2022-08-16 MED ORDER — FENTANYL CITRATE (PF) 100 MCG/2ML IJ SOLN
INTRAMUSCULAR | Status: AC
  Filled 2022-08-16: qty 2

## 2022-08-16 MED ORDER — HEPARIN (PORCINE) IN NACL 1000-0.9 UT/500ML-% IV SOLN
INTRAVENOUS | Status: AC
  Filled 2022-08-16: qty 1000

## 2022-08-16 MED ORDER — VERAPAMIL HCL 2.5 MG/ML IV SOLN
INTRAVENOUS | Status: AC
  Filled 2022-08-16: qty 2

## 2022-08-16 MED ORDER — SODIUM CHLORIDE 0.9% FLUSH: 3.0000 mL | Freq: Two times a day (BID) | INTRAVENOUS | Status: DC

## 2022-08-16 MED ORDER — SODIUM CHLORIDE 0.9% FLUSH: 3.0000 mL | INTRAVENOUS | Status: DC | PRN

## 2022-08-16 MED ORDER — FENTANYL CITRATE (PF) 100 MCG/2ML IJ SOLN
INTRAMUSCULAR | Status: DC | PRN
  Administered 2022-08-16: 50 ug via INTRAVENOUS

## 2022-08-16 MED ORDER — LIDOCAINE HCL (PF) 1 % IJ SOLN
INTRAMUSCULAR | Status: DC | PRN
  Administered 2022-08-16: 5 mL via INTRADERMAL

## 2022-08-16 SURGICAL SUPPLY — 11 items
CATH 5FR JL3.5 JR4 ANG PIG MP (CATHETERS) IMPLANT
CATH INFINITI 5 FR IM (CATHETERS) IMPLANT
CATH INFINITI 5FR AL1 (CATHETERS) IMPLANT
DEVICE RAD COMP TR BAND LRG (VASCULAR PRODUCTS) IMPLANT
GLIDESHEATH SLEND SS 6F .021 (SHEATH) IMPLANT
GUIDEWIRE INQWIRE 1.5J.035X260 (WIRE) IMPLANT
INQWIRE 1.5J .035X260CM (WIRE) ×1
KIT HEART LEFT (KITS) ×1 IMPLANT
PACK CARDIAC CATHETERIZATION (CUSTOM PROCEDURE TRAY) ×1 IMPLANT
TRANSDUCER W/STOPCOCK (MISCELLANEOUS) ×1 IMPLANT
TUBING CIL FLEX 10 FLL-RA (TUBING) ×1 IMPLANT

## 2022-08-16 NOTE — Progress Notes (Signed)
Patient and wife was given discharge instructions. Both verbalized understanding. 

## 2022-08-16 NOTE — Discharge Instructions (Addendum)
Resume Eliquis tomorrow if no bleeding from cath site in left wrist.   Drink plenty of fluid for 48 hours and keep wrist elevated at heart level for 24 hours  Radial Site Care   This sheet gives you information about how to care for yourself after your procedure. Your health care provider may also give you more specific instructions. If you have problems or questions, contact your health care provider. What can I expect after the procedure? After the procedure, it is common to have: Bruising and tenderness at the catheter insertion area. Follow these instructions at home: Medicines Take over-the-counter and prescription medicines only as told by your health care provider. Insertion site care Follow instructions from your health care provider about how to take care of your insertion site. Make sure you: Wash your hands with soap and water before you change your bandage (dressing). If soap and water are not available, use hand sanitizer. remove your dressing as told by your health care provider. In 24 hours Check your insertion site every day for signs of infection. Check for: Redness, swelling, or pain. Fluid or blood. Pus or a bad smell. Warmth. Do not take baths, swim, or use a hot tub until your health care provider approves. You may shower 24-48 hours after the procedure, or as directed by your health care provider. Remove the dressing and gently wash the site with plain soap and water. Pat the area dry with a clean towel. Do not rub the site. That could cause bleeding. Do not apply powder or lotion to the site. Activity   For 24 hours after the procedure, or as directed by your health care provider: Do not flex or bend the affected arm. Do not push or pull heavy objects with the affected arm. Do not drive yourself home from the hospital or clinic. You may drive 24 hours after the procedure unless your health care provider tells you not to. Do not operate machinery or power  tools. Do not lift anything that is heavier than 10 lb (4.5 kg), or the limit that you are told, until your health care provider says that it is safe. For 4 days Ask your health care provider when it is okay to: Return to work or school. Resume usual physical activities or sports. Resume sexual activity. General instructions If the catheter site starts to bleed, raise your arm and put firm pressure on the site. If the bleeding does not stop, get help right away. This is a medical emergency. If you went home on the same day as your procedure, a responsible adult should be with you for the first 24 hours after you arrive home. Keep all follow-up visits as told by your health care provider. This is important. Contact a health care provider if: You have a fever. You have redness, swelling, or yellow drainage around your insertion site. Get help right away if: You have unusual pain at the radial site. The catheter insertion area swells very fast. The insertion area is bleeding, and the bleeding does not stop when you hold steady pressure on the area. Your arm or hand becomes pale, cool, tingly, or numb. These symptoms may represent a serious problem that is an emergency. Do not wait to see if the symptoms will go away. Get medical help right away. Call your local emergency services (911 in the [place] Do not drive yourself to the hospital. Summary After the procedure, it is common to have bruising and tenderness at the site. Follow  instructions from your health care provider about how to take care of your radial site wound. Check the wound every day for signs of infection. Do not lift anything that is heavier than 10 lb (4.5 kg), or the limit that you are told, until your health care provider says that it is safe. This information is not intended to replace advice given to you by your health care provider. Make sure you discuss any questions you have with your health care provider. Document  Revised: 08/07/2017 Document Reviewed: 08/07/2017 Elsevier Patient Education  2020 [employer].

## 2022-08-16 NOTE — Interval H&P Note (Signed)
History and Physical Interval Note:  08/16/2022 10:01 AM  Martin Hooper  has presented today for surgery, with the diagnosis of abnormal stress test.  The various methods of treatment have been discussed with the patient and family. After consideration of risks, benefits and other options for treatment, the patient has consented to  Procedure(s): LEFT HEART CATH AND CORS/GRAFTS ANGIOGRAPHY (N/A) as a surgical intervention.  The patient's history has been reviewed, patient examined, no change in status, stable for surgery.  I have reviewed the patient's chart and labs.  Questions were answered to the patient's satisfaction.    Cath Lab Visit (complete for each Cath Lab visit)  Clinical Evaluation Leading to the Procedure:   ACS: No.  Non-ACS:    Anginal Classification: CCS III  Anti-ischemic medical therapy: Maximal Therapy (2 or more classes of medications)  Non-Invasive Test Results: High-risk stress test findings: cardiac mortality >3%/year  Prior CABG: Previous CABG        Lauree Chandler

## 2022-08-17 ENCOUNTER — Telehealth: Payer: Self-pay | Admitting: Internal Medicine

## 2022-08-17 NOTE — Telephone Encounter (Signed)
Patient has come questions/concerns falling his procedure. Please advise

## 2022-08-17 NOTE — Telephone Encounter (Signed)
Spoke to patient who is questioning when he can go back to work. Patient also stated that Dr. Shon Hough told him that he had no blockages, therefore it was unlikely that his CP was coming from any blockages and patient is concerned and would like to know if his Imdur needs to be adjusted as he is still having CP.   Please advise.

## 2022-08-20 ENCOUNTER — Other Ambulatory Visit: Payer: Self-pay

## 2022-08-20 ENCOUNTER — Telehealth: Payer: Self-pay | Admitting: Internal Medicine

## 2022-08-20 ENCOUNTER — Inpatient Hospital Stay (HOSPITAL_COMMUNITY)
Admission: EM | Admit: 2022-08-20 | Discharge: 2022-08-22 | DRG: 282 | Disposition: A | Discharge status: Home / Self Care | Payer: BC Managed Care – PPO | Attending: Internal Medicine | Admitting: Internal Medicine

## 2022-08-20 ENCOUNTER — Emergency Department (HOSPITAL_COMMUNITY): Payer: BC Managed Care – PPO

## 2022-08-20 ENCOUNTER — Encounter (HOSPITAL_COMMUNITY): Payer: Self-pay | Admitting: Internal Medicine

## 2022-08-20 DIAGNOSIS — D72829 Elevated white blood cell count, unspecified: Secondary | ICD-10-CM | POA: Diagnosis present

## 2022-08-20 DIAGNOSIS — E119 Type 2 diabetes mellitus without complications: Secondary | ICD-10-CM

## 2022-08-20 DIAGNOSIS — Z86718 Personal history of other venous thrombosis and embolism: Secondary | ICD-10-CM

## 2022-08-20 DIAGNOSIS — Z7902 Long term (current) use of antithrombotics/antiplatelets: Secondary | ICD-10-CM

## 2022-08-20 DIAGNOSIS — Z7901 Long term (current) use of anticoagulants: Secondary | ICD-10-CM

## 2022-08-20 DIAGNOSIS — Z809 Family history of malignant neoplasm, unspecified: Secondary | ICD-10-CM

## 2022-08-20 DIAGNOSIS — Z79899 Other long term (current) drug therapy: Secondary | ICD-10-CM

## 2022-08-20 DIAGNOSIS — Z951 Presence of aortocoronary bypass graft: Secondary | ICD-10-CM

## 2022-08-20 DIAGNOSIS — N1831 Chronic kidney disease, stage 3a: Secondary | ICD-10-CM | POA: Diagnosis present

## 2022-08-20 DIAGNOSIS — I214 Non-ST elevation (NSTEMI) myocardial infarction: Principal | ICD-10-CM | POA: Diagnosis present

## 2022-08-20 DIAGNOSIS — I513 Intracardiac thrombosis, not elsewhere classified: Secondary | ICD-10-CM | POA: Diagnosis not present

## 2022-08-20 DIAGNOSIS — Z91014 Allergy to mammalian meats: Secondary | ICD-10-CM

## 2022-08-20 DIAGNOSIS — Z9861 Coronary angioplasty status: Secondary | ICD-10-CM

## 2022-08-20 DIAGNOSIS — I25118 Atherosclerotic heart disease of native coronary artery with other forms of angina pectoris: Secondary | ICD-10-CM | POA: Diagnosis not present

## 2022-08-20 DIAGNOSIS — Z7982 Long term (current) use of aspirin: Secondary | ICD-10-CM

## 2022-08-20 DIAGNOSIS — Z833 Family history of diabetes mellitus: Secondary | ICD-10-CM

## 2022-08-20 DIAGNOSIS — R079 Chest pain, unspecified: Secondary | ICD-10-CM | POA: Diagnosis not present

## 2022-08-20 DIAGNOSIS — E669 Obesity, unspecified: Secondary | ICD-10-CM | POA: Diagnosis present

## 2022-08-20 DIAGNOSIS — Z794 Long term (current) use of insulin: Secondary | ICD-10-CM

## 2022-08-20 DIAGNOSIS — Z6831 Body mass index (BMI) 31.0-31.9, adult: Secondary | ICD-10-CM

## 2022-08-20 DIAGNOSIS — E1165 Type 2 diabetes mellitus with hyperglycemia: Secondary | ICD-10-CM | POA: Diagnosis present

## 2022-08-20 DIAGNOSIS — I129 Hypertensive chronic kidney disease with stage 1 through stage 4 chronic kidney disease, or unspecified chronic kidney disease: Secondary | ICD-10-CM | POA: Diagnosis present

## 2022-08-20 DIAGNOSIS — Z818 Family history of other mental and behavioral disorders: Secondary | ICD-10-CM

## 2022-08-20 DIAGNOSIS — Z7984 Long term (current) use of oral hypoglycemic drugs: Secondary | ICD-10-CM

## 2022-08-20 DIAGNOSIS — I25110 Atherosclerotic heart disease of native coronary artery with unstable angina pectoris: Secondary | ICD-10-CM | POA: Diagnosis present

## 2022-08-20 DIAGNOSIS — D631 Anemia in chronic kidney disease: Secondary | ICD-10-CM | POA: Diagnosis present

## 2022-08-20 DIAGNOSIS — E785 Hyperlipidemia, unspecified: Secondary | ICD-10-CM | POA: Diagnosis present

## 2022-08-20 DIAGNOSIS — E1122 Type 2 diabetes mellitus with diabetic chronic kidney disease: Secondary | ICD-10-CM | POA: Diagnosis present

## 2022-08-20 DIAGNOSIS — I252 Old myocardial infarction: Secondary | ICD-10-CM

## 2022-08-20 DIAGNOSIS — I255 Ischemic cardiomyopathy: Secondary | ICD-10-CM | POA: Diagnosis present

## 2022-08-20 DIAGNOSIS — I10 Essential (primary) hypertension: Secondary | ICD-10-CM | POA: Diagnosis present

## 2022-08-20 DIAGNOSIS — E7800 Pure hypercholesterolemia, unspecified: Secondary | ICD-10-CM | POA: Diagnosis present

## 2022-08-20 HISTORY — DX: Ischemic cardiomyopathy: I25.5

## 2022-08-20 HISTORY — DX: Intracardiac thrombosis, not elsewhere classified: I51.3

## 2022-08-20 HISTORY — DX: Chronic kidney disease, stage 3a: N18.31

## 2022-08-20 HISTORY — DX: Type 2 diabetes mellitus without complications: E11.9

## 2022-08-20 HISTORY — DX: Allergy to other foods: Z91.018

## 2022-08-20 HISTORY — DX: Atherosclerotic heart disease of native coronary artery without angina pectoris: I25.10

## 2022-08-20 LAB — CBC WITH DIFFERENTIAL/PLATELET
Abs Immature Granulocytes: 0.05 10*3/uL (ref 0.00–0.07)
Basophils Absolute: 0.1 10*3/uL (ref 0.0–0.1)
Basophils Relative: 1 %
Eosinophils Absolute: 0.2 10*3/uL (ref 0.0–0.5)
Eosinophils Relative: 2 %
HCT: 39.5 % (ref 39.0–52.0)
Hemoglobin: 12.8 g/dL — ABNORMAL LOW (ref 13.0–17.0)
Immature Granulocytes: 0 %
Lymphocytes Relative: 12 %
Lymphs Abs: 1.7 10*3/uL (ref 0.7–4.0)
MCH: 28.9 pg (ref 26.0–34.0)
MCHC: 32.4 g/dL (ref 30.0–36.0)
MCV: 89.2 fL (ref 80.0–100.0)
Monocytes Absolute: 0.8 10*3/uL (ref 0.1–1.0)
Monocytes Relative: 6 %
Neutro Abs: 11.6 10*3/uL — ABNORMAL HIGH (ref 1.7–7.7)
Neutrophils Relative %: 79 %
Platelets: 249 10*3/uL (ref 150–400)
RBC: 4.43 MIL/uL (ref 4.22–5.81)
RDW: 12.7 % (ref 11.5–15.5)
WBC: 14.4 10*3/uL — ABNORMAL HIGH (ref 4.0–10.5)
nRBC: 0 % (ref 0.0–0.2)

## 2022-08-20 LAB — COMPREHENSIVE METABOLIC PANEL
ALT: 22 U/L (ref 0–44)
AST: 23 U/L (ref 15–41)
Albumin: 3.5 g/dL (ref 3.5–5.0)
Alkaline Phosphatase: 52 U/L (ref 38–126)
Anion gap: 10 (ref 5–15)
BUN: 28 mg/dL — ABNORMAL HIGH (ref 8–23)
CO2: 24 mmol/L (ref 22–32)
Calcium: 8.9 mg/dL (ref 8.9–10.3)
Chloride: 99 mmol/L (ref 98–111)
Creatinine, Ser: 1.54 mg/dL — ABNORMAL HIGH (ref 0.61–1.24)
GFR, Estimated: 48 mL/min — ABNORMAL LOW (ref >60)
Glucose, Bld: 383 mg/dL — ABNORMAL HIGH (ref 70–99)
Potassium: 3.9 mmol/L (ref 3.5–5.1)
Sodium: 133 mmol/L — ABNORMAL LOW (ref 135–145)
Total Bilirubin: 0.6 mg/dL (ref 0.3–1.2)
Total Protein: 6.3 g/dL — ABNORMAL LOW (ref 6.5–8.1)

## 2022-08-20 LAB — HEMOGLOBIN A1C
Hgb A1c MFr Bld: 8.8 % — ABNORMAL HIGH (ref 4.8–5.6)
Mean Plasma Glucose: 205.86 mg/dL

## 2022-08-20 LAB — TROPONIN I (HIGH SENSITIVITY)
Troponin I (High Sensitivity): 51 ng/L — ABNORMAL HIGH (ref <18)
Troponin I (High Sensitivity): 169 ng/L (ref <18)
Troponin I (High Sensitivity): 517 ng/L (ref <18)
Troponin I (High Sensitivity): 552 ng/L (ref <18)

## 2022-08-20 LAB — GLUCOSE, CAPILLARY
Glucose-Capillary: 197 mg/dL — ABNORMAL HIGH (ref 70–99)
Glucose-Capillary: 335 mg/dL — ABNORMAL HIGH (ref 70–99)

## 2022-08-20 MED ORDER — NITROGLYCERIN 0.2 MG/HR TD PT24
0.4000 mg | MEDICATED_PATCH | Freq: Every day | TRANSDERMAL | Status: DC
  Administered 2022-08-20: 0.4 mg via TRANSDERMAL
  Filled 2022-08-20 (×3): qty 1

## 2022-08-20 MED ORDER — NITROGLYCERIN 0.4 MG SL SUBL
0.4000 mg | SUBLINGUAL_TABLET | Freq: Once | SUBLINGUAL | Status: AC
  Administered 2022-08-20: 0.4 mg via SUBLINGUAL

## 2022-08-20 MED ORDER — BENAZEPRIL HCL 20 MG PO TABS
40.0000 mg | ORAL_TABLET | Freq: Every day | ORAL | Status: DC
  Administered 2022-08-21 – 2022-08-22 (×2): 40 mg via ORAL
  Filled 2022-08-20 (×2): qty 2

## 2022-08-20 MED ORDER — ONDANSETRON HCL 4 MG/2ML IJ SOLN
4.0000 mg | Freq: Four times a day (QID) | INTRAMUSCULAR | Status: DC | PRN
  Administered 2022-08-21: 4 mg via INTRAVENOUS
  Filled 2022-08-20: qty 2

## 2022-08-20 MED ORDER — ACETAMINOPHEN 325 MG PO TABS
650.0000 mg | ORAL_TABLET | Freq: Four times a day (QID) | ORAL | Status: DC | PRN
  Administered 2022-08-22: 650 mg via ORAL
  Filled 2022-08-20: qty 2

## 2022-08-20 MED ORDER — POLYETHYLENE GLYCOL 3350 17 G PO PACK: 17.0000 g | PACK | Freq: Every day | ORAL | Status: DC | PRN

## 2022-08-20 MED ORDER — INSULIN GLARGINE-YFGN 100 UNIT/ML ~~LOC~~ SOLN
30.0000 [IU] | Freq: Every day | SUBCUTANEOUS | Status: DC
  Administered 2022-08-20 – 2022-08-21 (×2): 30 [IU] via SUBCUTANEOUS
  Filled 2022-08-20 (×3): qty 0.3

## 2022-08-20 MED ORDER — INSULIN ASPART 100 UNIT/ML IJ SOLN
0.0000 [IU] | Freq: Every day | INTRAMUSCULAR | Status: DC
  Administered 2022-08-20: 5 [IU] via SUBCUTANEOUS
  Administered 2022-08-21: 2 [IU] via SUBCUTANEOUS

## 2022-08-20 MED ORDER — FONDAPARINUX SODIUM 5 MG/0.4ML ~~LOC~~ SOLN
2.5000 mg | SUBCUTANEOUS | Status: DC
  Filled 2022-08-20 (×2): qty 0.4

## 2022-08-20 MED ORDER — HYDROCHLOROTHIAZIDE 25 MG PO TABS: 25.0000 mg | ORAL_TABLET | Freq: Every day | ORAL | Status: DC

## 2022-08-20 MED ORDER — METOPROLOL SUCCINATE ER 50 MG PO TB24
100.0000 mg | ORAL_TABLET | Freq: Every day | ORAL | Status: DC
  Administered 2022-08-21 – 2022-08-22 (×2): 100 mg via ORAL
  Filled 2022-08-20 (×2): qty 2

## 2022-08-20 MED ORDER — AMLODIPINE BESYLATE 5 MG PO TABS
5.0000 mg | ORAL_TABLET | Freq: Every day | ORAL | Status: DC
  Administered 2022-08-21 – 2022-08-22 (×2): 5 mg via ORAL
  Filled 2022-08-20 (×2): qty 1

## 2022-08-20 MED ORDER — PANTOPRAZOLE SODIUM 40 MG PO TBEC
40.0000 mg | DELAYED_RELEASE_TABLET | Freq: Every day | ORAL | Status: DC
  Administered 2022-08-21 – 2022-08-22 (×2): 40 mg via ORAL
  Filled 2022-08-20 (×2): qty 1

## 2022-08-20 MED ORDER — ATORVASTATIN CALCIUM 40 MG PO TABS
80.0000 mg | ORAL_TABLET | Freq: Every day | ORAL | Status: DC
  Administered 2022-08-20 – 2022-08-22 (×3): 80 mg via ORAL
  Filled 2022-08-20 (×3): qty 2

## 2022-08-20 MED ORDER — MORPHINE SULFATE (PF) 2 MG/ML IV SOLN
2.0000 mg | Freq: Once | INTRAVENOUS | Status: AC
  Administered 2022-08-20: 2 mg via INTRAVENOUS
  Filled 2022-08-20: qty 1

## 2022-08-20 MED ORDER — ONDANSETRON HCL 4 MG PO TABS: 4.0000 mg | ORAL_TABLET | Freq: Four times a day (QID) | ORAL | Status: DC | PRN

## 2022-08-20 MED ORDER — ACETAMINOPHEN 325 MG RE SUPP: 650.0000 mg | Freq: Four times a day (QID) | RECTAL | Status: DC | PRN

## 2022-08-20 MED ORDER — INSULIN ASPART 100 UNIT/ML IJ SOLN
0.0000 [IU] | Freq: Three times a day (TID) | INTRAMUSCULAR | Status: DC
  Administered 2022-08-20: 3 [IU] via SUBCUTANEOUS
  Administered 2022-08-21: 8 [IU] via SUBCUTANEOUS
  Administered 2022-08-21: 15 [IU] via SUBCUTANEOUS
  Administered 2022-08-21: 2 [IU] via SUBCUTANEOUS
  Administered 2022-08-22: 5 [IU] via SUBCUTANEOUS

## 2022-08-20 MED ORDER — ASPIRIN 81 MG PO TBEC
81.0000 mg | DELAYED_RELEASE_TABLET | Freq: Every day | ORAL | Status: DC
  Administered 2022-08-21 – 2022-08-22 (×2): 81 mg via ORAL
  Filled 2022-08-20 (×2): qty 1

## 2022-08-20 MED ORDER — MORPHINE SULFATE (PF) 2 MG/ML IV SOLN
2.0000 mg | INTRAVENOUS | Status: DC | PRN
  Administered 2022-08-20 – 2022-08-21 (×2): 2 mg via INTRAVENOUS
  Filled 2022-08-20 (×2): qty 1

## 2022-08-20 MED ORDER — ISOSORBIDE MONONITRATE ER 60 MG PO TB24: 60.0000 mg | ORAL_TABLET | Freq: Every day | ORAL | 3 refills | Status: DC

## 2022-08-20 MED ORDER — NITROGLYCERIN 0.4 MG SL SUBL
0.4000 mg | SUBLINGUAL_TABLET | Freq: Once | SUBLINGUAL | Status: DC
  Filled 2022-08-20: qty 1

## 2022-08-20 MED ORDER — FONDAPARINUX SODIUM 7.5 MG/0.6ML ~~LOC~~ SOLN
2.5000 mg | SUBCUTANEOUS | Status: DC
  Administered 2022-08-20: 2.5 mg via SUBCUTANEOUS
  Filled 2022-08-20 (×3): qty 0.6

## 2022-08-20 NOTE — Assessment & Plan Note (Signed)
Systolic 169I to 503U. -Resume Norvasc, benazepril, HCTZ, metoprolol

## 2022-08-20 NOTE — ED Provider Notes (Signed)
[facility] Provider Note   CSN: [id number] Arrival date & time: 08/20/22  1109     History {Add pertinent medical, surgical, social history, OB history to HPI:1} Chief Complaint  Patient presents with   Chest Pain    Martin Hooper is a 72 y.o. male.  Patient with a history of coronary disease.  Patient states he started having chest discomfort today.  He had a cardiac cath last week which showed some disease   Chest Pain      Home Medications Prior to Admission medications   Medication Sig Start Date End Date Taking? Authorizing Provider  amLODipine (NORVASC) 5 MG tablet Take 1 tablet (5 mg total) by mouth daily. 08/14/22 08/09/23 Yes Mallipeddi, Vishnu P, MD  apixaban (ELIQUIS) 5 MG TABS tablet Take 1 tablet (5 mg total) by mouth 2 (two) times daily. 07/20/22 04/16/23 Yes Mallipeddi, Vishnu P, MD  Ascorbic Acid (VITAMIN C) 1000 MG tablet Take 1,000 mg by mouth daily.   Yes [provider]  aspirin EC 325 MG tablet Take 325 mg by mouth daily.   Yes [provider]  atorvastatin (LIPITOR) 80 MG tablet Take 80 mg by mouth daily.   Yes [provider]  benazepril (LOTENSIN) 40 MG tablet Take 40 mg by mouth daily.   Yes [provider]  Cholecalciferol 125 MCG (5000 UT) TABS Take 5,000 Units by mouth daily.   Yes [provider]  Coenzyme Q10 100 MG TABS Take 100 mg by mouth daily.   Yes [provider]  diphenhydrAMINE (BENADRYL) 25 MG tablet Take 25 mg by mouth at bedtime.   Yes [provider]  esomeprazole (NEXIUM) 20 MG capsule Take 20 mg by mouth daily.   Yes [provider]  ferrous sulfate 325 (65 FE) MG tablet Take 325 mg by mouth daily with breakfast.   Yes [provider]  hydrochlorothiazide (HYDRODIURIL) 25 MG tablet Take 25 mg by mouth daily.   Yes [provider]  isosorbide mononitrate (IMDUR) 60 MG 24 hr tablet Take 1 tablet (60 mg  total) by mouth daily. 08/20/22 08/15/23 Yes Mallipeddi, Vishnu P, MD  metFORMIN (GLUCOPHAGE-XR) 500 MG 24 hr tablet Take 500-1,000 mg by mouth See admin instructions. Take 1000mg  in the AM and 500mg  in the PM.   Yes [provider]  metoprolol succinate (TOPROL-XL) 100 MG 24 hr tablet Take 100 mg by mouth daily.   Yes [provider]  nitroGLYCERIN (NITROSTAT) 0.4 MG SL tablet Place 1 tablet (0.4 mg total) under the tongue every 5 (five) minutes as needed for chest pain. 07/12/22 10/10/22 Yes Mallipeddi, Vishnu P, MD  Omega-3 Fatty Acids (FISH OIL) 1200 MG CAPS Take 1,200 mg by mouth daily.   Yes [provider]  tamsulosin (FLOMAX) 0.4 MG CAPS capsule Take 0.4 mg by mouth in the morning and at bedtime.   Yes [provider]  tiZANidine (ZANAFLEX) 4 MG tablet Take 2 mg by mouth every 6 (six) hours as needed for muscle spasms.   Yes [provider]  TRESIBA FLEXTOUCH 200 UNIT/ML FlexTouch Pen Inject 68 Units into the skin at bedtime.   Yes [provider]  Zinc 30 MG CAPS Take 30 mg by mouth daily.   Yes [provider]      Allergies    Alpha-gal, Beef (bovine) protein, and Pork-derived products    Review of Systems   Review of Systems  Cardiovascular:  Positive for  chest pain.    Physical Exam Updated Vital Signs BP (!) 161/74   Pulse (!) 44   Temp 98.3 F (36.8 C) (Oral)   Resp 14   Ht 5\' 8"  (1.727 m)   Wt 94.4 kg   SpO2 98%   BMI 31.64 kg/m  Physical Exam  ED Results / Procedures / Treatments   Labs (all labs ordered are listed, but only abnormal results are displayed) Labs Reviewed  CBC WITH DIFFERENTIAL/PLATELET - Abnormal; Notable for the following components:      Result Value   WBC 14.4 (*)    Hemoglobin 12.8 (*)    Neutro Abs 11.6 (*)    All other components within normal limits  COMPREHENSIVE METABOLIC PANEL - Abnormal; Notable for the following components:   Sodium 133 (*)    Glucose, Bld 383 (*)     BUN 28 (*)    Creatinine, Ser 1.54 (*)    Total Protein 6.3 (*)    GFR, Estimated 48 (*)    All other components within normal limits  TROPONIN I (HIGH SENSITIVITY) - Abnormal; Notable for the following components:   Troponin I (High Sensitivity) 51 (*)    All other components within normal limits  TROPONIN I (HIGH SENSITIVITY) - Abnormal; Notable for the following components:   Troponin I (High Sensitivity) 169 (*)    All other components within normal limits    EKG EKG Interpretation  Date/Time:  Monday August 20 2022 11:16:32 EST Ventricular Rate:  55 PR Interval:  191 QRS Duration: 118 QT Interval:  428 QTC Calculation: 410 R Axis:   38 Text Interpretation: Sinus rhythm Nonspecific intraventricular conduction delay Low voltage, extremity and precordial leads Consider anterior infarct Nonspecific T abnormalities, lateral leads Confirmed by Milton Ferguson [phone]) on 08/20/2022 12:54:01 PM  Radiology DG Chest Port 1 View  Result Date: 08/20/2022 CLINICAL DATA:  Chest pain EXAM: PORTABLE CHEST 1 VIEW COMPARISON:  Prior chest x-ray 07/03/2022 FINDINGS: Prior surgical changes of median sternotomy with evidence of multivessel CABG. Metallic stent projects over the LAD. Cardiac and mediastinal contours are unchanged. No pneumothorax, pleural effusion or evidence of pulmonary edema. No acute osseous abnormality. IMPRESSION: Stable chest x-ray without evidence of acute cardiopulmonary process. Electronically Signed   By: Jacqulynn Cadet M.D.   On: 08/20/2022 12:06    Procedures Procedures  {Document cardiac monitor, telemetry assessment procedure when appropriate:1}  Medications Ordered in ED Medications  nitroGLYCERIN (NITROSTAT) SL tablet 0.4 mg (has no administration in time range)  aspirin EC tablet 81 mg (has no administration in time range)  nitroGLYCERIN (NITRODUR - Dosed in mg/24 hr) patch 0.4 mg (has no administration in time range)  nitroGLYCERIN (NITROSTAT) SL tablet 0.4  mg (0.4 mg Sublingual Given 08/20/22 1509)    ED Course/ Medical Decision Making/ A&P   {   Click here for ABCD2, HEART and other calculatorsREFRESH Note before signing :1}                          Medical Decision Making Amount and/or Complexity of Data Reviewed Labs: ordered. Radiology: ordered. ECG/medicine tests: ordered.  Risk Prescription drug management. Decision regarding hospitalization.   Patient with NSTEMI.  He will be admitted to medicine with cardiology consult  {Document critical care time when appropriate:1} {Document review of labs and clinical decision tools ie heart score, Chads2Vasc2 etc:1}  {Document your independent review of radiology images, and any outside records:1} {Document your discussion with family  members, caretakers, and with consultants:1} {Document social determinants of health affecting pt's care:1} {Document your decision making why or why not admission, treatments were needed:1} Final Clinical Impression(s) / ED Diagnoses Final diagnoses:  NSTEMI (non-ST elevated myocardial infarction) ([place])    Rx / DC Orders ED Discharge Orders     None

## 2022-08-20 NOTE — H&P (Addendum)
History and Physical    [name] BJY:[id number] DOB: [birth year] DOA: 08/20/2022  PCP: Caryl Bis, MD   Patient coming from: Home  I have personally briefly reviewed patient's old medical records in [place]  Chief Complaint: Chest Pain  HPI: Martin Hooper is a 72 y.o. male with medical history significant for CABG 2013 and PCI 2000, LV thrombus, hypertension, diabetes mellitus. Patient presented to the ED with complaints of chest pain that started this morning while he was getting ready for work.  Chest pain has been continuous since onset, mid chest.  Reports associated back pain, and pain to both sides of his neck and jaw, no radiation down his arm, no associated difficulty breathing.  No improvement in chest pain with rest.  He took 2 nitroglycerin at home which did not help, and here in the ED he reports minimal relief from sublingual nitro.  He did not take his Eliquis today, instead he took 324 mg of aspirin.  Patient underwent cardiac cath 08/16/2022, with no focal targets for PCI.  Plans to continue medical management of CAD.  He was also diagnosed with LV mural thrombus, started on anticoagulation with Eliquis.  ED Course: Heart rate 44-58.  Otherwise stable vitals. Trop 51 > 169. EKG without change from prior. Cardiologist consulted-hold Eliquis, start heparin drip for now,-later changed to fondaparinux due to allergy.  Admit here for NSTEMI, nitro patch, hold home Imdur, as needed morphine.   Review of Systems: As per HPI all other systems reviewed and negative.  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 He has never used smokeless tobacco. He reports that he does not drink alcohol and does not use drugs.  Allergies  Allergen Reactions   Alpha-Gal Anaphylaxis    Any mammal meat    Beef (Bovine) Protein Anaphylaxis   Pork-Derived Products Anaphylaxis    Family History  Problem Relation Age of Onset   Diabetes Mother    Schizophrenia Mother    Cancer Father     Prior to Admission medications   Medication Sig Start Date End Date Taking? Authorizing Provider  amLODipine (NORVASC) 5 MG tablet Take 1 tablet (5 mg total) by mouth daily. 08/14/22 08/09/23 Yes Mallipeddi, Vishnu P, MD  apixaban (ELIQUIS) 5 MG TABS tablet Take 1 tablet (5 mg total) by mouth 2 (two) times daily. 07/20/22 04/16/23 Yes Mallipeddi, Vishnu P, MD  Ascorbic Acid (VITAMIN C) 1000 MG tablet Take 1,000 mg by mouth daily.   Yes [provider]  aspirin EC 325 MG tablet Take 325 mg by mouth daily.   Yes [provider]  atorvastatin (LIPITOR) 80 MG tablet Take 80 mg by mouth daily.   Yes [provider]  benazepril (LOTENSIN) 40 MG tablet Take 40 mg by mouth daily.   Yes [provider]  Cholecalciferol 125 MCG (5000 UT) TABS Take 5,000 Units  by mouth daily.   Yes [provider]  Coenzyme Q10 100 MG TABS Take 100 mg by mouth daily.   Yes [provider]  diphenhydrAMINE (BENADRYL) 25 MG tablet Take 25 mg by mouth at bedtime.   Yes [provider]  esomeprazole (NEXIUM) 20 MG capsule Take 20 mg by mouth daily.   Yes [provider]  ferrous sulfate 325 (65 FE) MG tablet Take 325 mg by mouth daily with breakfast.   Yes [provider]  hydrochlorothiazide (HYDRODIURIL) 25 MG tablet Take 25 mg by mouth daily.   Yes [provider]  isosorbide mononitrate (IMDUR) 60 MG 24 hr tablet Take 1  tablet (60 mg total) by mouth daily. 08/20/22 08/15/23 Yes Mallipeddi, Vishnu P, MD  metFORMIN (GLUCOPHAGE-XR) 500 MG 24 hr tablet Take 500-1,000 mg by mouth See admin instructions. Take 1000mg  in the AM and 500mg  in the PM.   Yes [provider]  metoprolol succinate (TOPROL-XL) 100 MG 24 hr tablet Take 100 mg by mouth daily.   Yes [provider]  nitroGLYCERIN (NITROSTAT) 0.4 MG SL tablet Place 1 tablet (0.4 mg total) under the tongue every 5 (five) minutes as needed for chest pain. 07/12/22 10/10/22 Yes Mallipeddi, Vishnu P, MD  Omega-3 Fatty Acids (FISH OIL) 1200 MG CAPS Take 1,200 mg by mouth daily.   Yes [provider]  tamsulosin (FLOMAX) 0.4 MG CAPS capsule Take 0.4 mg by mouth in the morning and at bedtime.   Yes [provider]  tiZANidine (ZANAFLEX) 4 MG tablet Take 2 mg by mouth every 6 (six) hours as needed for muscle spasms.   Yes [provider]  TRESIBA FLEXTOUCH 200 UNIT/ML FlexTouch Pen Inject 68 Units into the skin at bedtime.   Yes [provider]  Zinc 30 MG CAPS Take 30 mg by mouth daily.   Yes [provider]    Physical Exam: Vitals:   08/20/22 1400 08/20/22 1430 08/20/22 1500 08/20/22 1509  BP: (!) 170/74 (!) 163/74 (!) 161/74   Pulse: (!) 46 (!) 50 (!) 44   Resp:   14   Temp:    98.3 F (36.8 C)  TempSrc:    Oral  SpO2: 98% 98% 98%   Weight:    94.4 kg  Height:    5\' 8"  (1.727 m)    Constitutional: NAD, calm, comfortable Vitals:   08/20/22 1400 08/20/22 1430 08/20/22 1500 08/20/22 1509  BP: (!) 170/74 (!) 163/74 (!) 161/74   Pulse: (!) 46 (!) 50 (!) 44   Resp:   14   Temp:    98.3 F (36.8 C)  TempSrc:    Oral  SpO2: 98% 98% 98%   Weight:    94.4 kg  Height:    5\' 8"  (1.727 m)   Eyes: PERRL, lids and conjunctivae normal ENMT: Mucous membranes are moist.  Neck: normal, supple, no masses, no thyromegaly Respiratory: clear to auscultation bilaterally, no wheezing, no crackles. Normal  respiratory effort. No accessory muscle use.  Cardiovascular: Regular rate and rhythm, no murmurs / rubs / gallops. No extremity edema.  Extremities warm Abdomen: no tenderness, no masses palpated. No hepatosplenomegaly.   Musculoskeletal: no clubbing / cyanosis. No joint deformity upper and lower extremities.  Skin: no rashes, lesions, ulcers. No induration Neurologic: No apparent cranial nerve abnormality, moving extremities spontaneously.Marland Kitchen  Psychiatric: Normal judgment and insight. Alert and oriented x 3. Normal mood.   Labs on Admission: I have personally reviewed following labs and  imaging studies  CBC: Recent Labs  Lab 08/20/22 1131  WBC 14.4*  NEUTROABS 11.6*  HGB 12.8*  HCT 39.5  MCV 89.2  PLT 536   Basic Metabolic Panel: Recent Labs  Lab 08/14/22 0932 08/20/22 1131  NA 135 133*  K 3.8 3.9  CL 100 99  CO2 26 24  GLUCOSE 251* 383*  BUN 26* 28*  CREATININE 1.59* 1.54*  CALCIUM 9.1 8.9   GFR: Estimated Creatinine Clearance: 49 mL/min (A) (by C-G formula based on SCr of 1.54 mg/dL (H)). Liver Function Tests: Recent Labs  Lab 08/20/22 1131  AST 23  ALT 22  ALKPHOS 52  BILITOT 0.6  PROT 6.3*  ALBUMIN 3.5   CBG: Recent Labs  Lab 08/16/22 0807 08/16/22 1206  GLUCAP 183* 127*   Radiological Exams on Admission: DG Chest Port 1 View  Result Date: 08/20/2022 CLINICAL DATA:  Chest pain EXAM: PORTABLE CHEST 1 VIEW COMPARISON:  Prior chest x-ray 07/03/2022 FINDINGS: Prior surgical changes of median sternotomy with evidence of multivessel CABG. Metallic stent projects over the LAD. Cardiac and mediastinal contours are unchanged. No pneumothorax, pleural effusion or evidence of pulmonary edema. No acute osseous abnormality. IMPRESSION: Stable chest x-ray without evidence of acute cardiopulmonary process. Electronically Signed   By: Jacqulynn Cadet M.D.   On: 08/20/2022 12:06    EKG: Independently reviewed.  Sinus rhythm, rate 55, QTc 410.  No significant change  from prior.  Assessment/Plan Principal Problem:   NSTEMI (non-ST elevated myocardial infarction) ([place]) Active Problems:   HTN, goal below 130/80   HLD (hyperlipidemia)   LV (left ventricular) mural thrombus   Coronary artery disease of native artery of native heart with stable angina pectoris (HCC)   Diabetes ([place])   Assessment and Plan: * NSTEMI (non-ST elevated myocardial infarction) ([place]) History of CABG- 2000, PCI - 2013 LV thrombus.  Recent cardiac cath 08/16/2022- Severe three vessel CAD s/p 5V CABG with 4/5 patent bypass grafts Chronic occlusion mid LAD. The mid and distal LAD fills from the patent LIMA graft.The Diagonal branch fills from the patent vein graft. The Circumflex is totally occluded in the mid segment.  -Persistent chest pain, reports minimal improvement with nitro -IV morphine, SL nitro as needed -Troponin 51 > 169, trend for now -EKG without significant change, repeat in a.m. - Per Cardiology-recent cardiac cath without PCI targets, start heparin drip, Nitropatch, hold Imdur, resume aspirin.  When back on home Eliquis will d/c aspirin. - Due to allergy, fondaparinux started -He is on metoprolol, heart rate currently 44-58, monitor heart rate while on beta-blocker   Diabetes (HCC) - Hold metformin -Resume Tresiba at reduced dose 30 units daily - HgbA1c - SSI- M  LV (left ventricular) mural thrombus Hold Eliquis for now, use fondaparinux  HLD (hyperlipidemia) Resume statins  HTN, goal below 644/03 Systolic 474Q to 595G. -Resume Norvasc, benazepril, HCTZ, metoprolol   DVT prophylaxis:  Fondaparinux Code Status:  FULL Family Communication: Spouse at bedside Disposition Plan: ~ 2 days Consults called: CArdiology Admission status: Obs tele   Author: Bethena Roys, MD 08/20/2022 5:11 PM  For on call review [email or web address].

## 2022-08-20 NOTE — Progress Notes (Addendum)
ANTICOAGULATION CONSULT NOTE - Initial Consult  Pharmacy Consult for Heparin Indication: chest pain/ACS  Allergies  Allergen Reactions   Alpha-Gal Anaphylaxis    Any mammal meat    Beef (Bovine) Protein Anaphylaxis   Pork-Derived Products Anaphylaxis    Patient Measurements: Height: 5\' 8"  (172.7 cm) Weight: 94.4 kg (208 lb 1.8 oz) IBW/kg (Calculated) : 68.4 HEPARIN DW (KG): 88.2   Vital Signs: Temp: 98.3 F (36.8 C) (02/05 1509) Temp Source: Oral (02/05 1509) BP: 161/74 (02/05 1500) Pulse Rate: 44 (02/05 1500)  Labs: Recent Labs    08/20/22 1131 08/20/22 1342  HGB 12.8*  --   HCT 39.5  --   PLT 249  --   CREATININE 1.54*  --   TROPONINIHS 51* 169*    Estimated Creatinine Clearance: 49 mL/min (A) (by C-G formula based on SCr of 1.54 mg/dL (H)).   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 Plan is for medical management given cath. Pharmacy asked to start heparin. Patient is on oral anticoagulation with eliquis  at home and last dose was 2/4 at 2230.  Patient with alphagal allergy and pork and beef derived products. Unable to use Heparin. Notified Dr. Harl Bowie   Goal of Therapy:  Monitor platelets by anticoagulation protocol: Yes   Plan:  Patient allergic to pork products, unable to use heparin Consulted Dr. Harl Bowie and will use arixtra  Martin Hooper, BS Pharm D, BCPS Clinical Pharmacist 08/20/2022,3:28 PM

## 2022-08-20 NOTE — Progress Notes (Signed)
EKG done and given to nurse 

## 2022-08-20 NOTE — Assessment & Plan Note (Signed)
-   Hold metformin -Resume Tresiba at reduced dose 30 units daily - HgbA1c - SSI- M

## 2022-08-20 NOTE — ED Notes (Signed)
Spoke with Dr Roderic Palau and advised that troponin level was 169

## 2022-08-20 NOTE — Telephone Encounter (Signed)
Pt c/o of Chest Pain: STAT if CP now or developed within 24 hours  1. Are you having CP right now? Just went away for the most part but did have to call 911 this morning because the pain was severe.  2. Are you experiencing any other symptoms (ex. SOB, nausea, vomiting, sweating)? No, just mild chest pain right now  3. How long have you been experiencing CP? Just started this morning right after he ate breakfast.   4. Is your CP continuous or coming and going? continuous  5. Have you taken Nitroglycerin? Yes, took two with no relief   Took 325mg  aspirin and wants to know if he can his eliquis.  ?

## 2022-08-20 NOTE — Assessment & Plan Note (Signed)
Resume statins

## 2022-08-20 NOTE — Progress Notes (Addendum)
Patient discussed with ER staff. CAD s/p PCI in 2000, 5V CABG (LIMA to LAD, SVG to OM 2, OM 3, D1, PDA) in 2013 with LVEF 45% and apical akinesis, HTN, DM2, CKD stage III presents with chest pain   Jan 2024 Stress: Findings are consistent with inferior infarction with moderate to high amount of peri-infarct ischemia. 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 The study is intermediate to high risk based on degree of inferior peri-infarct ischemia and decreased LVEF    Feb 2024 cath Severe three vessel CAD s/p 5V CABG with 4/5 patent bypass grafts Chronic occlusion mid LAD. The mid and distal LAD fills from the patent LIMA graft.The Diagonal Ellin Fitzgibbons fills from the patent vein graft The Circumflex is totally occluded in the mid segment. The sequential vein graft to the second and third OM branches is patent to the second OM Byrd Terrero but the distal limb of this graft to the small third OM Latora Quarry is occluded. There is collateral filling of this third OM Anajulia Leyendecker from collaterals supplied through the SVG to Diagonal.  The large dominant RCA is occluded in the mid segment. Patent vein graft to the distal RCA   Recommendations: No focal targets for PCI. I suspect that his recent change in symptoms is related to the occlusion of the distal limb of the sequential vein graft to the most distal OM Mao Lockner. This small Caedin Mogan fills from left to left collaterals. Continue medical management of CAD.     Chest pain resolved in ER with SL NG. Anticipate some degree of angina given his disease, work with antianginals to control symptoms. Looks like imdur increased to 60mg  just today, would continue this dose. If trop flat or trending down ok for ER discharge.    308pm addendum Uptrending trop to 169, would admit to [place] for NSTEMI. Plans for medical management given recent cath without PCI targets. Start hep gtt, can place NG patch and hold his home imdur. Can use prn morphine, if ongoing pain could escalate to NG gtt. While on hep gtt  can start aspirin, when back on home eliquis would d/c aspirin Full consult to follow in AM.   Note pharmacy has pointed out pork and alpha gal allergy, cannot use heparin or lovenox. Started on fondaparinux, hold his home eliquis.   Carlyle Dolly MD

## 2022-08-20 NOTE — Assessment & Plan Note (Addendum)
  Troponin: [id number], >>>312  -Denies any chest pain this a.m., no changes on EKG    - History of CABG- 2000, PCI - 2013 LV thrombus.   - Recent cardiac cath 08/16/2022- Severe three vessel CAD s/p 5V CABG with 4/5 patent bypass grafts Chronic occlusion mid LAD. The mid and distal LAD fills from the patent LIMA graft.The Diagonal branch fills from the patent vein graft. The Circumflex is totally occluded in the mid segment.   -Persistent chest pain,Chest pain intermittent reports minimal improvement with nitro -IV morphine, SL nitro as needed  -EKG without significant change, - Per Cardiology-recent cardiac cath without PCI targets, start heparin drip, Nitropatch, hold Imdur, resume aspirin.    - Due to allergy, Eliquis held, transitioned to fondaparinux  -On Toprol 100 mg daily, amlodipine 5 mg daily, Imdur 30 mg -Atorvastatin 80 mg daily  -PRN morphine has decreased pain according to patient.   Current vitals: Blood pressure (!) 155/67, pulse (!) 49, temp. 98.5 F (36.9 C), RR 18, SpO2 93 %.  - pending Cardiology eval.

## 2022-08-20 NOTE — Assessment & Plan Note (Signed)
Hold Eliquis for now, use fondaparinux

## 2022-08-20 NOTE — Telephone Encounter (Signed)
Patient had cardiac cath 08/16/22 at [facility]  This am he had severe CP, took 2 NTG , 325 mg ASA and called 911. He did NOT take his am dose of Eliquis  Patient states EMS did an EKG, took his vital signs and left, they did not transport him to the hospital.  He continues to have have CP. I advised him to go direct to [facility] ED to have cardiac monitoring, troponin levels ,etc. His wife is driving him.   I will FYI Dr.Mallipeddi

## 2022-08-20 NOTE — ED Triage Notes (Addendum)
Pt in from home with central cp "heaviness" since eating breakfast this am. Hx of MI and 5V CABG in 13'. States he had a heart cath angiography on Thursday and they found a new clot in L ventricle, started on Eliquis since. Reporting some pain radiation upper back. Pt did call EMS this morning, but decided to come POV. 2 NTG given no relief, did take 324mg  ASA PTA. Pain still 10/10

## 2022-08-20 NOTE — Telephone Encounter (Signed)
Patient notified via My Chart

## 2022-08-21 ENCOUNTER — Other Ambulatory Visit (HOSPITAL_COMMUNITY): Payer: Self-pay

## 2022-08-21 ENCOUNTER — Encounter (HOSPITAL_COMMUNITY): Payer: Self-pay | Admitting: Internal Medicine

## 2022-08-21 ENCOUNTER — Inpatient Hospital Stay (HOSPITAL_COMMUNITY): Payer: BC Managed Care – PPO

## 2022-08-21 ENCOUNTER — Other Ambulatory Visit (HOSPITAL_COMMUNITY): Payer: Self-pay | Admitting: *Deleted

## 2022-08-21 DIAGNOSIS — E7800 Pure hypercholesterolemia, unspecified: Secondary | ICD-10-CM | POA: Diagnosis present

## 2022-08-21 DIAGNOSIS — D72829 Elevated white blood cell count, unspecified: Secondary | ICD-10-CM | POA: Diagnosis present

## 2022-08-21 DIAGNOSIS — Z7982 Long term (current) use of aspirin: Secondary | ICD-10-CM | POA: Diagnosis not present

## 2022-08-21 DIAGNOSIS — R079 Chest pain, unspecified: Secondary | ICD-10-CM

## 2022-08-21 DIAGNOSIS — Z818 Family history of other mental and behavioral disorders: Secondary | ICD-10-CM | POA: Diagnosis not present

## 2022-08-21 DIAGNOSIS — I129 Hypertensive chronic kidney disease with stage 1 through stage 4 chronic kidney disease, or unspecified chronic kidney disease: Secondary | ICD-10-CM | POA: Diagnosis present

## 2022-08-21 DIAGNOSIS — Z7984 Long term (current) use of oral hypoglycemic drugs: Secondary | ICD-10-CM | POA: Diagnosis not present

## 2022-08-21 DIAGNOSIS — Z833 Family history of diabetes mellitus: Secondary | ICD-10-CM | POA: Diagnosis not present

## 2022-08-21 DIAGNOSIS — E669 Obesity, unspecified: Secondary | ICD-10-CM | POA: Diagnosis present

## 2022-08-21 DIAGNOSIS — Z951 Presence of aortocoronary bypass graft: Secondary | ICD-10-CM | POA: Diagnosis not present

## 2022-08-21 DIAGNOSIS — D631 Anemia in chronic kidney disease: Secondary | ICD-10-CM | POA: Diagnosis present

## 2022-08-21 DIAGNOSIS — I252 Old myocardial infarction: Secondary | ICD-10-CM | POA: Diagnosis not present

## 2022-08-21 DIAGNOSIS — N1831 Chronic kidney disease, stage 3a: Secondary | ICD-10-CM | POA: Diagnosis present

## 2022-08-21 DIAGNOSIS — Z6831 Body mass index (BMI) 31.0-31.9, adult: Secondary | ICD-10-CM | POA: Diagnosis not present

## 2022-08-21 DIAGNOSIS — I255 Ischemic cardiomyopathy: Secondary | ICD-10-CM | POA: Diagnosis present

## 2022-08-21 DIAGNOSIS — Z7901 Long term (current) use of anticoagulants: Secondary | ICD-10-CM | POA: Diagnosis not present

## 2022-08-21 DIAGNOSIS — Z79899 Other long term (current) drug therapy: Secondary | ICD-10-CM | POA: Diagnosis not present

## 2022-08-21 DIAGNOSIS — I10 Essential (primary) hypertension: Secondary | ICD-10-CM | POA: Diagnosis not present

## 2022-08-21 DIAGNOSIS — Z91014 Allergy to mammalian meats: Secondary | ICD-10-CM | POA: Diagnosis not present

## 2022-08-21 DIAGNOSIS — I214 Non-ST elevation (NSTEMI) myocardial infarction: Secondary | ICD-10-CM | POA: Diagnosis present

## 2022-08-21 DIAGNOSIS — I2510 Atherosclerotic heart disease of native coronary artery without angina pectoris: Secondary | ICD-10-CM | POA: Diagnosis not present

## 2022-08-21 DIAGNOSIS — I25110 Atherosclerotic heart disease of native coronary artery with unstable angina pectoris: Secondary | ICD-10-CM | POA: Diagnosis present

## 2022-08-21 DIAGNOSIS — Z7902 Long term (current) use of antithrombotics/antiplatelets: Secondary | ICD-10-CM | POA: Diagnosis not present

## 2022-08-21 DIAGNOSIS — Z9861 Coronary angioplasty status: Secondary | ICD-10-CM | POA: Diagnosis not present

## 2022-08-21 DIAGNOSIS — E1122 Type 2 diabetes mellitus with diabetic chronic kidney disease: Secondary | ICD-10-CM | POA: Diagnosis present

## 2022-08-21 DIAGNOSIS — E1165 Type 2 diabetes mellitus with hyperglycemia: Secondary | ICD-10-CM | POA: Diagnosis present

## 2022-08-21 DIAGNOSIS — Z794 Long term (current) use of insulin: Secondary | ICD-10-CM | POA: Diagnosis not present

## 2022-08-21 LAB — BASIC METABOLIC PANEL
Anion gap: 8 (ref 5–15)
Anion gap: 8 (ref 5–15)
BUN: 25 mg/dL — ABNORMAL HIGH (ref 8–23)
BUN: 28 mg/dL — ABNORMAL HIGH (ref 8–23)
CO2: 26 mmol/L (ref 22–32)
CO2: 28 mmol/L (ref 22–32)
Calcium: 8.6 mg/dL — ABNORMAL LOW (ref 8.9–10.3)
Calcium: 9 mg/dL (ref 8.9–10.3)
Chloride: 102 mmol/L (ref 98–111)
Chloride: 104 mmol/L (ref 98–111)
Creatinine, Ser: 1.52 mg/dL — ABNORMAL HIGH (ref 0.61–1.24)
Creatinine, Ser: 1.52 mg/dL — ABNORMAL HIGH (ref 0.61–1.24)
GFR, Estimated: 49 mL/min — ABNORMAL LOW (ref >60)
GFR, Estimated: 49 mL/min — ABNORMAL LOW (ref >60)
Glucose, Bld: 121 mg/dL — ABNORMAL HIGH (ref 70–99)
Glucose, Bld: 248 mg/dL — ABNORMAL HIGH (ref 70–99)
Potassium: 3.4 mmol/L — ABNORMAL LOW (ref 3.5–5.1)
Potassium: 3.6 mmol/L (ref 3.5–5.1)
Sodium: 136 mmol/L (ref 135–145)
Sodium: 140 mmol/L (ref 135–145)

## 2022-08-21 LAB — ECHOCARDIOGRAM LIMITED
Height: 68 in
S' Lateral: 3.8 cm
Weight: 3234.59 oz

## 2022-08-21 LAB — GLUCOSE, CAPILLARY
Glucose-Capillary: 123 mg/dL — ABNORMAL HIGH (ref 70–99)
Glucose-Capillary: 374 mg/dL — ABNORMAL HIGH (ref 70–99)
Glucose-Capillary: 294 mg/dL — ABNORMAL HIGH (ref 70–99)
Glucose-Capillary: 233 mg/dL — ABNORMAL HIGH (ref 70–99)

## 2022-08-21 LAB — CBC
HCT: 37.1 % — ABNORMAL LOW (ref 39.0–52.0)
HCT: 39.9 % (ref 39.0–52.0)
Hemoglobin: 12.6 g/dL — ABNORMAL LOW (ref 13.0–17.0)
Hemoglobin: 13.3 g/dL (ref 13.0–17.0)
MCH: 29.6 pg (ref 26.0–34.0)
MCH: 29.6 pg (ref 26.0–34.0)
MCHC: 33.3 g/dL (ref 30.0–36.0)
MCHC: 34 g/dL (ref 30.0–36.0)
MCV: 87.3 fL (ref 80.0–100.0)
MCV: 88.9 fL (ref 80.0–100.0)
Platelets: 251 10*3/uL (ref 150–400)
Platelets: 266 10*3/uL (ref 150–400)
RBC: 4.25 MIL/uL (ref 4.22–5.81)
RBC: 4.49 MIL/uL (ref 4.22–5.81)
RDW: 12.6 % (ref 11.5–15.5)
RDW: 12.9 % (ref 11.5–15.5)
WBC: 12.5 10*3/uL — ABNORMAL HIGH (ref 4.0–10.5)
WBC: 13 10*3/uL — ABNORMAL HIGH (ref 4.0–10.5)
nRBC: 0 % (ref 0.0–0.2)
nRBC: 0 % (ref 0.0–0.2)

## 2022-08-21 LAB — TROPONIN I (HIGH SENSITIVITY)
Troponin I (High Sensitivity): 312 ng/L (ref <18)
Troponin I (High Sensitivity): 431 ng/L (ref <18)
Troponin I (High Sensitivity): 482 ng/L (ref <18)

## 2022-08-21 LAB — TSH: TSH: 2.382 u[IU]/mL (ref 0.350–4.500)

## 2022-08-21 MED ORDER — ISOSORBIDE MONONITRATE ER 60 MG PO TB24
60.0000 mg | ORAL_TABLET | Freq: Every day | ORAL | Status: DC
  Administered 2022-08-21 – 2022-08-22 (×2): 60 mg via ORAL
  Filled 2022-08-21 (×3): qty 1

## 2022-08-21 MED ORDER — HYDROCHLOROTHIAZIDE 25 MG PO TABS
25.0000 mg | ORAL_TABLET | Freq: Every day | ORAL | Status: DC
  Administered 2022-08-22: 25 mg via ORAL
  Filled 2022-08-21 (×2): qty 1

## 2022-08-21 MED ORDER — CLOPIDOGREL BISULFATE 75 MG PO TABS
75.0000 mg | ORAL_TABLET | Freq: Every day | ORAL | Status: DC
  Administered 2022-08-21 – 2022-08-22 (×2): 75 mg via ORAL
  Filled 2022-08-21 (×2): qty 1

## 2022-08-21 MED ORDER — NITROGLYCERIN 0.4 MG/HR TD PT24
0.4000 mg | MEDICATED_PATCH | Freq: Every day | TRANSDERMAL | Status: DC
  Filled 2022-08-21 (×2): qty 1

## 2022-08-21 MED ORDER — PERFLUTREN LIPID MICROSPHERE
1.0000 mL | INTRAVENOUS | Status: AC | PRN
  Administered 2022-08-21: 2 mL via INTRAVENOUS

## 2022-08-21 MED ORDER — FONDAPARINUX SODIUM 2.5 MG/0.5ML ~~LOC~~ SOLN
2.5000 mg | SUBCUTANEOUS | Status: DC
  Administered 2022-08-21: 2.5 mg via SUBCUTANEOUS
  Filled 2022-08-21 (×3): qty 0.5

## 2022-08-21 NOTE — TOC Progression Note (Signed)
  Transition of Care [facility]) Screening Note   Patient Details  Name: Martin Hooper Date of Birth: Dec 02, 1950   Transition of Care [facility]) CM/SW Contact:    Shade Flood, LCSW Phone Number: 08/21/2022, 7:56 AM    Transition of Care Department [facility]) has reviewed patient and no TOC needs have been identified at this time. We will continue to monitor patient advancement through interdisciplinary progression rounds. If new patient transition needs arise, please place a TOC consult.

## 2022-08-21 NOTE — Progress Notes (Signed)
PROGRESS NOTE    Patient: Martin Hooper                            PCP: Caryl Bis, MD                    DOB: 02/13/1951            DOA: 08/20/2022 ZSW:[id number]             DOS: 08/21/2022, 11:01 AM   LOS: 0 days   Date of Service: The patient was seen and examined on 08/21/2022  Subjective:   The patient was seen and examined this morning. Hemodynamically stable. Currently not complaining of any chest pain or shortness of breath No issues overnight .  Brief Narrative:   Martin Hooper is a 72 y.o. male with medical history significant for CABG 2013 and PCI 2000, LV thrombus, hypertension, diabetes mellitus. Patient presented to the ED with complaints of chest pain that started this morning while he was getting ready for work.  Chest pain has been continuous since onset, mid chest.  Reports associated back pain, and pain to both sides of his neck and jaw, no radiation down his arm, no associated difficulty breathing.  No improvement in chest pain with rest.  He took 2 nitroglycerin at home which did not help, and here in the ED he reports minimal relief from sublingual nitro.  He did not take his Eliquis today, instead he took 324 mg of aspirin.   Patient underwent cardiac cath 08/16/2022, with no focal targets for PCI.  Plans to continue medical management of CAD.  He was also diagnosed with LV mural thrombus, started on anticoagulation with Eliquis.   ED Course: Heart rate 44-58.  Otherwise stable vitals. Trop 51 > 169. EKG without change from prior. Cardiologist consulted-hold Eliquis, start heparin drip for now,-later changed to fondaparinux due to allergy.  Admit here for NSTEMI, nitro patch, hold home Imdur, as needed morphine.    Assessment & Plan:   Principal Problem:   NSTEMI (non-ST elevated myocardial infarction) ([place]) Active Problems:   HTN, goal below 130/80   HLD (hyperlipidemia)   LV (left ventricular) mural thrombus   Coronary artery disease of native  artery of native heart with stable angina pectoris (HCC)   Diabetes ([place])   Stage 3a chronic kidney disease (CKD) ([place])     Assessment and Plan: * NSTEMI (non-ST elevated myocardial infarction) ([place])  Troponin: 322,025, >>>312  -Denies any chest pain this a.m., no changes on EKG    - History of CABG- 2000, PCI - 2013 LV thrombus.   - Recent cardiac cath 08/16/2022- Severe three vessel CAD s/p 5V CABG with 4/5 patent bypass grafts Chronic occlusion mid LAD. The mid and distal LAD fills from the patent LIMA graft.The Diagonal branch fills from the patent vein graft. The Circumflex is totally occluded in the mid segment.   -Persistent chest pain,Chest pain intermittent reports minimal improvement with nitro -IV morphine, SL nitro as needed  -EKG without significant change, - Per Cardiology-recent cardiac cath without PCI targets, start heparin drip, Nitropatch, hold Imdur, resume aspirin.    - Due to allergy, Eliquis held, transitioned to fondaparinux  -On Toprol 100 mg daily, amlodipine 5 mg daily, Imdur 30 mg -Atorvastatin 80 mg daily  -PRN morphine has decreased pain according to patient.   Current vitals: Blood pressure (!) 155/67, pulse (!) 49, temp. 98.5  F (36.9 C), RR 18, SpO2 93 %.  - pending Cardiology eval.    Stage 3a chronic kidney disease (CKD) ([place]) Lab Results  Component Value Date   CREATININE 1.52 (H) 08/21/2022   CREATININE 1.54 (H) 08/20/2022   CREATININE 1.52 (H) 08/20/2022   Creatinine seems to be at baseline, avoid nephrotoxins -Monitoring closely  Diabetes (HCC) - Hold metformin -Resume Tresiba at reduced dose 30 units daily - HgbA1c - SSI- M  LV (left ventricular) mural thrombus Hold Eliquis for now, use Fondaparinux Dr. Dellia Cloud plans to repeat echo with contrast prior to next OV, will clarify timing with MD, do not yet see scheduled   HLD (hyperlipidemia) Resume statins  HTN, goal below 867/61 Systolic 950D to 326Z. -Resume  Norvasc, benazepril, HCTZ, metoprolol     ------------------------------------------------------------------------------------------------------------------------- Nutritional status:  The patient's BMI is: Body mass index is 30.74 kg/m. I agree with the assessment and plan as outlined -----------------------------------------------------------------------------------------------------------------------  DVT prophylaxis:     Code Status:   Code Status: Full Code  Family Communication: No family member present at bedside- attempt will be made to update daily The above findings and plan of care has been discussed with patient (and family)  in detail,  they expressed understanding and agreement of above. -Advance care planning has been discussed.   Admission status:   Status is: Observation The patient remains OBS appropriate and will d/c before 2 midnights.   Disposition: From  - home             Planning for discharge in 1-2 days: to   Procedures:   No admission procedures for hospital encounter.   Antimicrobials:  Anti-infectives (From admission, onward)    None        Medication:   amLODipine  5 mg Oral Daily   aspirin EC  81 mg Oral Daily   atorvastatin  80 mg Oral Daily   benazepril  40 mg Oral Daily   clopidogrel  75 mg Oral Daily   fondaparinux (ARIXTRA) injection  2.5 mg Subcutaneous Q24H   [START ON 08/22/2022] hydrochlorothiazide  25 mg Oral Daily   insulin aspart  0-15 Units Subcutaneous TID WC   insulin aspart  0-5 Units Subcutaneous QHS   insulin glargine-yfgn  30 Units Subcutaneous QHS   isosorbide mononitrate  60 mg Oral Daily   metoprolol succinate  100 mg Oral Daily   pantoprazole  40 mg Oral Daily    acetaminophen **OR** acetaminophen, morphine injection, ondansetron **OR** ondansetron (ZOFRAN) IV, polyethylene glycol   Objective:   Vitals:   08/20/22 2200 08/21/22 0002 08/21/22 0200 08/21/22 0518  BP: (!) 147/78 (!) 142/78 (!) 140/80  (!) 155/67  Pulse: (!) 49 (!) 50 (!) 48 (!) 49  Resp: 18 16 16 18   Temp:  97.9 F (36.6 C)  98.5 F (36.9 C)  TempSrc:  Oral  Oral  SpO2: 97% 94% 96% 93%  Weight:      Height:        Intake/Output Summary (Last 24 hours) at 08/21/2022 1101 Last data filed at 08/21/2022 0845 Gross per 24 hour  Intake 240 ml  Output --  Net 240 ml   Filed Weights   08/20/22 1123 08/20/22 1509 08/20/22 1721  Weight: 94.4 kg 94.4 kg 91.7 kg     Physical examination:   Constitution:  Alert, cooperative, no distress,  Appears calm and comfortable  Psychiatric:   Normal and stable mood and affect, cognition intact,   HEENT:  Normocephalic, PERRL, otherwise with in Normal limits  Chest:         Chest symmetric Cardio vascular:  S1/S2, RRR, No murmure, No Rubs or Gallops  pulmonary: Clear to auscultation bilaterally, respirations unlabored, negative wheezes / crackles Abdomen: Soft, non-tender, non-distended, bowel sounds,no masses, no organomegaly Muscular skeletal: Limited exam - in bed, able to move all 4 extremities,   Neuro: CNII-XII intact. , normal motor and sensation, reflexes intact  Extremities: No pitting edema lower extremities, +2 pulses  Skin: Dry, warm to touch, negative for any Rashes, No open wounds Wounds: per nursing documentation   ------------------------------------------------------------------------------------------------------------------------------------------    LABs:     Latest Ref Rng & Units 08/21/2022    8:32 AM 08/20/2022   11:31 AM 08/20/2022   12:00 AM  CBC  WBC 4.0 - 10.5 K/uL 12.5  14.4  13.0   Hemoglobin 13.0 - 17.0 g/dL 13.3  12.8  12.6   Hematocrit 39.0 - 52.0 % 39.9  39.5  37.1   Platelets 150 - 400 K/uL 266  249  251       Latest Ref Rng & Units 08/21/2022    8:32 AM 08/20/2022   11:31 AM 08/20/2022   12:00 AM  CMP  Glucose 70 - 99 mg/dL 121  383  248   BUN 8 - 23 mg/dL 25  28  28    Creatinine 0.61 - 1.24 mg/dL 1.52  1.54  1.52   Sodium 135 -  145 mmol/L 140  133  136   Potassium 3.5 - 5.1 mmol/L 3.6  3.9  3.4   Chloride 98 - 111 mmol/L 104  99  102   CO2 22 - 32 mmol/L 28  24  26    Calcium 8.9 - 10.3 mg/dL 9.0  8.9  8.6   Total Protein 6.5 - 8.1 g/dL  6.3    Total Bilirubin 0.3 - 1.2 mg/dL  0.6    Alkaline Phos 38 - 126 U/L  52    AST 15 - 41 U/L  23    ALT 0 - 44 U/L  22         Micro Results No results found for this or any previous visit (from the past 240 hour(s)).  Radiology Reports DG Chest Port 1 View  Result Date: 08/20/2022 CLINICAL DATA:  Chest pain EXAM: PORTABLE CHEST 1 VIEW COMPARISON:  Prior chest x-ray 07/03/2022 FINDINGS: Prior surgical changes of median sternotomy with evidence of multivessel CABG. Metallic stent projects over the LAD. Cardiac and mediastinal contours are unchanged. No pneumothorax, pleural effusion or evidence of pulmonary edema. No acute osseous abnormality. IMPRESSION: Stable chest x-ray without evidence of acute cardiopulmonary process. Electronically Signed   By: Jacqulynn Cadet M.D.   On: 08/20/2022 12:06    SIGNED: Deatra James, MD, FHM. FAAFP. Zacarias Pontes - [facility] Time spent > 35 min.  In seeing, evaluating and examining the patient. Reviewing medical records, labs, drawn plan of care. [facility],  Pager (please use [email or web address] to page/ text) Please use Epic Secure Chat for non-urgent communication (7AM-7PM)  If 7PM-7AM, please contact night-coverage [email or web address], 08/21/2022, 11:01 AM

## 2022-08-21 NOTE — Progress Notes (Signed)
  Echocardiogram 2D Echocardiogram has been performed.  Martin Hooper 08/21/2022, 4:04 PM

## 2022-08-21 NOTE — Hospital Course (Signed)
Martin Hooper is a 72 y.o. male with medical history significant for CABG 2013 and PCI 2000, LV thrombus, hypertension, diabetes mellitus. Patient presented to the ED with complaints of chest pain that started this morning while he was getting ready for work.  Chest pain has been continuous since onset, mid chest.  Reports associated back pain, and pain to both sides of his neck and jaw, no radiation down his arm, no associated difficulty breathing.  No improvement in chest pain with rest.  He took 2 nitroglycerin at home which did not help, and here in the ED he reports minimal relief from sublingual nitro.  He did not take his Eliquis today, instead he took 324 mg of aspirin.   Patient underwent cardiac cath 08/16/2022, with no focal targets for PCI.  Plans to continue medical management of CAD.  He was also diagnosed with LV mural thrombus, started on anticoagulation with Eliquis.   ED Course: Heart rate 44-58.  Otherwise stable vitals. Trop 51 > 169. EKG without change from prior. Cardiologist consulted-hold Eliquis, start heparin drip for now,-later changed to fondaparinux due to allergy.  Admit here for NSTEMI, nitro patch, hold home Imdur, as needed morphine.

## 2022-08-21 NOTE — Assessment & Plan Note (Signed)
Lab Results  Component Value Date   CREATININE 1.52 (H) 08/21/2022   CREATININE 1.54 (H) 08/20/2022   CREATININE 1.52 (H) 08/20/2022   Creatinine seems to be at baseline, avoid nephrotoxins -Monitoring closely

## 2022-08-21 NOTE — Progress Notes (Signed)
Patient has had chest pain intermittent through this shift.  Patient has described pain in center of chest, slightly to the left.  Patient states pain feels at time like a " muscle or rib pain."  EKG done earlier this shift.  Troponins did increase this shift and MD was notified.  Serial troponins were ordered.  Patient has  had no c/o sob, diaphoresis, or pain radiating down extremities.  Patient also stated that those previous symptoms were asymptomatic for him.  Patient vitals have been stable, although bradycardic this shift , not any higher than 55 or 56 bpm.  PRN morphine has decreased pain according to patient. Patient has stated this shift that his pain is a 3/10, with pain medication is decreased to 2/10.  Wife has been at bedside.

## 2022-08-21 NOTE — Consult Note (Addendum)
Cardiology Consultation   Patient ID: Martin Hooper MRN: [id number]; DOB: Sep 25, 1950  Admit date: 08/20/2022 Date of Consult: 08/21/2022  PCP:  Caryl Bis, MD   [place] Providers Cardiologist:  None        Patient Profile:   Martin Hooper is a 72 y.o. male with a hx of CAD s/p PCI in 2000, 5V CABG (LIMA to LAD, SVG to OM 2, OM 3, D1, PDA) in 2013 with LVEF 45% and apical akinesis at that time, recent echo 07/2022 with EF 55-60% but + LV thrombus, HTN, DM2, CKD stage IIIa, baseline sinus bradycardia on metoprolol, pork and alpha gal allergy who is being seen 08/21/2022 for the evaluation of chest pain/elevated troponin at the request of Dr. Roger Shelter.  History of Present Illness:   Martin Hooper was previously followed at [facility] before establishing care with Dr. Dellia Cloud in 06/2022 for recurrent chest pain. 2D echo and stress test were ordered. 2D echo 07/20/22 showed EF 55-60%, G2DD, apical LV akinetic/aneurysmal segment with rounded and somewhat calcified 1x1cm mural thrombus, mild MR. Aspirin was discontinued and he was started on Eliquis. CRP was 1.0, ESR wnl, and d-dimer 0.70. Stress test was abnormal. At f/u 1/30 he was started on Imdur 30mg  daily with amlodipine dose reduced from 10mg  to 5mg  daily. He underwent cath 08/16/22 showing severe 3V native disease with 4/5 patent bypass grafts - The sequential vein graft to the second and third OM branches was patent to the second OM Martin Hooper but the distal limb of this graft to the small third OM Martin Hooper was occluded, with collateral filling of this third OM Martin Hooper from collaterals supplied through the SVG to Diagonal. There were no focal targets for PCI. Dr. Angelena Form suspected the recent change in symptoms was related to the occlusion of the distal limb of the sequential vein graft to the most distal OM Martin Hooper, and continued medical therapy was recommended.  He states he felt well for several days then yesterday developed  recurrent chest pain much worse than prior discomfort, rated 9/10, describes as tightness (making fists) across his whole chest. No SOB, n/v or diaphoresis. He took 324mg  ASA and 2 SL NTG with minimal relief so called EMS. By the time they arrived he was pain free and reportedly deemed stable so he did not go to the hospital. He developed recurrent CP so called our office and was recommended to report to ER. He has felt this pain both with exertion and at rest as well. He got 1 SL NTG in the ER with reported resolution of pain though the patient doesn't feel this was the case.  Upon arrival to the hospital, Imdur was increased to 60mg  daily Dr. Harl Bowie had reviewed chart and recommended to hold Imdur and start NTG patch with PRN morphine. Heparin was considered but deferred due to pork/alpha gal allergy in lieu of fondaparinux. He required morphine overnight for recurrence and pain subsided completely. He is currently pain free. Labs notable for hsTroponins 51->169->517->552->482->431->312, leukocytosis of 13k, mild decline in Hgb to 12.6, Na 133 in setting of glucose 383, Cr 1.54 c/w prior.  CXR NAD.    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 Authorizing Provider  amLODipine (NORVASC) 5 MG tablet Take 1 tablet (5 mg total) by mouth  daily. 08/14/22 08/09/23 Yes Mallipeddi, Vishnu P, MD  apixaban (ELIQUIS) 5 MG TABS tablet Take 1 tablet (5 mg total) by mouth 2 (two) times daily. 07/20/22 04/16/23 Yes Mallipeddi, Vishnu P, MD  Ascorbic Acid (VITAMIN C) 1000 MG tablet Take 1,000 mg by mouth daily.   Yes [provider]  aspirin EC 325 MG tablet Take 325 mg by mouth daily.   Yes [provider]  atorvastatin (LIPITOR) 80 MG tablet Take 80 mg by mouth daily.   Yes [provider]  benazepril (LOTENSIN) 40 MG tablet Take 40 mg by mouth daily.   Yes [provider]  Cholecalciferol 125 MCG (5000 UT) TABS Take 5,000 Units by mouth daily.   Yes [provider]  Coenzyme Q10 100 MG TABS Take 100 mg by mouth daily.   Yes [provider]  diphenhydrAMINE (BENADRYL) 25 MG tablet Take 25 mg by mouth at bedtime.   Yes [provider]  esomeprazole (NEXIUM) 20 MG capsule Take 20 mg by mouth daily.   Yes [provider]  ferrous sulfate 325 (65 FE) MG tablet Take 325 mg by mouth daily with breakfast.   Yes [provider]  hydrochlorothiazide (HYDRODIURIL) 25 MG tablet Take 25 mg by mouth daily.   Yes [provider]  isosorbide mononitrate (IMDUR) 60 MG 24 hr tablet Take 1 tablet (60 mg total) by mouth daily. 08/20/22 08/15/23 Yes Mallipeddi, Vishnu P, MD  metFORMIN (GLUCOPHAGE-XR) 500 MG 24 hr tablet Take 500-1,000 mg by mouth See admin instructions. Take 1000mg  in the AM and 500mg  in the PM.   Yes [provider]  metoprolol succinate (TOPROL-XL) 100 MG 24 hr tablet Take 100 mg by mouth daily.   Yes [provider]  nitroGLYCERIN (NITROSTAT) 0.4 MG SL tablet Place 1 tablet (0.4 mg total) under the tongue every 5 (five) minutes as needed for chest pain. 07/12/22 10/10/22 Yes Mallipeddi, Vishnu P, MD  Omega-3 Fatty Acids (FISH OIL) 1200 MG CAPS Take 1,200 mg by mouth daily.   Yes [provider]  tamsulosin (FLOMAX) 0.4 MG CAPS capsule  Take 0.4 mg by mouth in the morning and at bedtime.   Yes [provider]  tiZANidine (ZANAFLEX) 4 MG tablet Take 2 mg by mouth every 6 (six) hours as needed for muscle spasms.   Yes [provider]  TRESIBA FLEXTOUCH 200 UNIT/ML FlexTouch Pen Inject 68 Units into the skin at bedtime.   Yes [provider]  Zinc 30 MG CAPS Take 30 mg by mouth daily.   Yes [provider]    Inpatient Medications: Scheduled Meds:  amLODipine  5 mg Oral Daily   aspirin EC  81 mg Oral Daily   atorvastatin  80 mg Oral Daily   benazepril  40 mg Oral Daily   fondaparinux (ARIXTRA) injection  2.5 mg Subcutaneous Q24H   [START ON 08/22/2022]  hydrochlorothiazide  25 mg Oral Daily   insulin aspart  0-15 Units Subcutaneous TID WC   insulin aspart  0-5 Units Subcutaneous QHS   insulin glargine-yfgn  30 Units Subcutaneous QHS   metoprolol succinate  100 mg Oral Daily   nitroGLYCERIN  0.4 mg Transdermal Daily   nitroGLYCERIN  0.4 mg Sublingual Once   pantoprazole  40 mg Oral Daily   Continuous Infusions:  PRN Meds: acetaminophen **OR** acetaminophen, morphine injection, ondansetron **OR** ondansetron (ZOFRAN) IV, polyethylene glycol  Allergies:    Allergies  Allergen Reactions   Alpha-Gal Anaphylaxis    Any mammal meat    Beef (Bovine) Protein Anaphylaxis   Pork-Derived Products Anaphylaxis    Social History:   Social History   Socioeconomic History   Marital status: Married    Spouse name: Not on file   Number of children: Not on file   Years of education: Not on file   Highest education level: Not on file  Occupational History   Not on file  Tobacco Use   Smoking status: Never   Smokeless tobacco: Never  Vaping Use   Vaping Use: Never used  Substance and Sexual Activity   Alcohol use: Never   Drug use: Never   Sexual activity: Not on file  Other Topics Concern   Not on file  Social History Narrative   Not on file   Social Determinants of Health    Financial Resource Strain: Not on file  Food Insecurity: No Food Insecurity (08/20/2022)   Hunger Vital Sign    Worried About Running Out of Food in the Last Year: Never true    Ran Out of Food in the Last Year: Never true  Transportation Needs: No Transportation Needs (08/20/2022)   PRAPARE - Hydrologist (Medical): No    Lack of Transportation (Non-Medical): No  Physical Activity: Not on file  Stress: Not on file  Social Connections: Not on file  Intimate Partner Violence: Not At Risk (08/20/2022)   Humiliation, Afraid, Rape, and Kick questionnaire    Fear of Current or Ex-Partner: No    Emotionally Abused: No    Physically Abused: No    Sexually Abused: No    Family History:   Family History  Problem Relation Age of Onset   Diabetes Mother    Schizophrenia Mother    Cancer Father      ROS:  Please see the history of present illness.  All other ROS reviewed and negative.     Physical Exam/Data:   Vitals:   08/20/22 2200 08/21/22 0002 08/21/22 0200 08/21/22 0518  BP: (!) 147/78 (!) 142/78 (!) 140/80 (!) 155/67  Pulse: (!) 49 (!) 50 (!) 48 (!) 49  Resp: 18 16 16 18   Temp:  97.9 F (36.6 C)  98.5 F (36.9 C)  TempSrc:  Oral  Oral  SpO2: 97% 94% 96% 93%  Weight:      Height:       No intake or output data in the 24 hours ending 08/21/22 0838    08/20/2022    5:21 PM 08/20/2022    3:09 PM 08/20/2022   11:23 AM  Last 3 Weights  Weight (lbs) 202 lb 2.6 oz 208 lb 1.8 oz 208 lb 1.8 oz  Weight (kg) 91.7 kg 94.4 kg 94.4 kg     Body mass index is 30.74 kg/m.  General: Well developed, well nourished, in no acute distress. Head: Normocephalic, atraumatic, sclera non-icteric, no xanthomas,  nares are without discharge. Neck: Negative for carotid bruits. JVP not elevated. Lungs: Clear bilaterally to auscultation without wheezes, rales, or rhonchi. Breathing is unlabored. Heart: RRR S1 S2 without murmurs, rubs, or gallops.  Abdomen: Soft, non-tender,  non-distended with normoactive bowel sounds. No rebound/guarding. Extremities: No clubbing or cyanosis. No edema. Distal pedal pulses are 2+ and equal bilaterally. L radial cath site c/d/I, no hematoma or ecchymosis, good pulse Neuro: Alert and oriented X 3. Moves all extremities spontaneously. Psych:  Responds to questions appropriately with a normal affect.   EKG:  The EKG was personally reviewed and demonstrates:  SB 55bpm, poor R wave progression, nonspecific STTW changes including TWI I, avL F/u tracing similar, 48bpm Telemetry:  Telemetry was personally reviewed and demonstrates:  SB nadir 47bpm, otherwise 50s  Relevant CV Studies: Cath 08/16/22   Prox RCA lesion is 50% stenosed.   Mid RCA lesion is 100% stenosed.   2nd Diag lesion is 100% stenosed.   Mid Cx lesion is 100% stenosed.   Mid Graft to Insertion lesion between 2nd Mrg and 3rd Mrg  is 99% stenosed.   Mid LAD lesion is 100% stenosed.   SVG graft was visualized by angiography.   SVG graft was visualized by angiography.   LIMA graft was visualized by non-selective angiography.   Severe three vessel CAD s/p 5V CABG with 4/5 patent bypass grafts Chronic occlusion mid LAD. The mid and distal LAD fills from the patent LIMA graft.The Diagonal Tynan Boesel fills from the patent vein graft The Circumflex is totally occluded in the mid segment. The sequential vein graft to the second and third OM branches is patent to the second OM Woodie Degraffenreid but the distal limb of this graft to the small third OM Vennie Salsbury is occluded. There is collateral filling of this third OM Nialah Saravia from collaterals supplied through the SVG to Diagonal.  The large dominant RCA is occluded in the mid segment. Patent vein graft to the distal RCA   Recommendations: No focal targets for PCI. I suspect that his recent change in symptoms is related to the occlusion of the distal limb of the sequential vein graft to the most distal OM Tyshana Nishida. This small Cliffton Spradley fills from left to left  collaterals. Continue medical management of CAD.    2D echo 07/20/22   1. Left ventricular ejection fraction, by estimation, is 55 to 60%. The  left ventricle has normal function. The left ventricle demonstrates  regional wall motion abnormalities (see scoring diagram/findings for  description). There is mild concentric left  ventricular hypertrophy. Left ventricular diastolic parameters are  consistent with Grade II diastolic dysfunction (pseudonormalization).   2. Apical LV akinetic/aneurysmal segment with rounded and somewhat  calcified, 1 x 1 cm mural thrombus. Results reported to Dr. Dellia Cloud at  time of report.   3. Right ventricular systolic function is normal. The right ventricular  size is normal. Tricuspid regurgitation signal is inadequate for assessing  PA pressure.   4. Left atrial size was mildly dilated.   5. The mitral valve is grossly normal, mildly calcified. Mild mitral  valve regurgitation.   6. The aortic valve is tricuspid. Aortic valve regurgitation is not  visualized.   7. The inferior vena cava is normal in size with greater than 50%  respiratory variability, suggesting right atrial pressure of 3 mmHg.   Comparison(s): Prior images unable to be directly viewed.   Laboratory Data:  High Sensitivity Troponin:   Recent Labs  Lab 08/20/22 1841 08/20/22 2145 08/21/22 0000  08/21/22 0202 08/21/22 0617  TROPONINIHS 517* 552* 482* 431* 312*     Chemistry Recent Labs  Lab 08/14/22 0932 08/20/22 0000 08/20/22 1131  NA 135 136 133*  K 3.8 3.4* 3.9  CL 100 102 99  CO2 26 26 24   GLUCOSE 251* 248* 383*  BUN 26* 28* 28*  CREATININE 1.59* 1.52* 1.54*  CALCIUM 9.1 8.6* 8.9  GFRNONAA 46* 49* 48*  ANIONGAP 9 8 10     Recent Labs  Lab 08/20/22 1131  PROT 6.3*  ALBUMIN 3.5  AST 23  ALT 22  ALKPHOS 52  BILITOT 0.6   Lipids No results for input(s): "CHOL", "TRIG", "HDL", "LABVLDL", "LDLCALC", "CHOLHDL" in the last 168 hours.  Hematology Recent Labs   Lab 08/20/22 0000 08/20/22 1131  WBC 13.0* 14.4*  RBC 4.25 4.43  HGB 12.6* 12.8*  HCT 37.1* 39.5  MCV 87.3 89.2  MCH 29.6 28.9  MCHC 34.0 32.4  RDW 12.9 12.7  PLT 251 249   Thyroid No results for input(s): "TSH", "FREET4" in the last 168 hours.  BNPNo results for input(s): "BNP", "PROBNP" in the last 168 hours.  DDimer No results for input(s): "DDIMER" in the last 168 hours.   Radiology/Studies:  DG Chest Port 1 View  Result Date: 08/20/2022 CLINICAL DATA:  Chest pain EXAM: PORTABLE CHEST 1 VIEW COMPARISON:  Prior chest x-ray 07/03/2022 FINDINGS: Prior surgical changes of median sternotomy with evidence of multivessel CABG. Metallic stent projects over the LAD. Cardiac and mediastinal contours are unchanged. No pneumothorax, pleural effusion or evidence of pulmonary edema. No acute osseous abnormality. IMPRESSION: Stable chest x-ray without evidence of acute cardiopulmonary process. Electronically Signed   By: Jacqulynn Cadet M.D.   On: 08/20/2022 12:06     Assessment and Plan:   1. NSTEMI/recurrent angina with known CAD s/p PCI 2000, 5V CABG 2013 with recent cath 2/1 with 4/5 grafts patent, no targets for PCI - remains on  ASA 81mg  daily - Eliquis held, transitioned to fondaparinux - on Toprol 100mg  daily, cannot titrate with baseline sinus bradycardia - on amlodipine 5mg  daily, recently decreased to accommodate addition of Imdur but with apparent BP room to increase back if needed - on NTG patch (on Imdur 30mg  PTA with BP room to titrate) - will discuss regimen and plan with MD, may need to consider addition of antiplatelet scheduled back to addition of Eliquis given rule in this admission (ASA 325mg  listed on med list but pt states this was just the one time dose prior to EMS being called) - continue atorvastatin 80mg  and check lipid in AM if still here - get baseline TSH given CP - regarding additional differential dx, d-dimer was recently elevated at 0.70 but in the setting  of LV thrombus - age adjusted this is actually wnl  2. Recent LV thrombus, ischemic cardiomyopathy with apical WMA - Eliquis held, transitioned to fondaparinux - Dr. Dellia Cloud plans to repeat echo with contrast prior to next OV, will clarify timing with MD, do not yet see scheduled  3. Essential HTN - SBP's elevated with room to titrate meds if needed - on amlodipine 5mg , benazepril 40mg , HCTZ 25mg  (FYI queueing up for next dose tomorrow instead), Toprol 100mg  daily, NTG patch  4. CKD 3a - recheck labs pending  5. Mild normocytic anemia - recheck labs pending  Risk Assessment/Risk Scores:     TIMI Risk Score for Unstable Angina or Non-ST Elevation MI:   The patient's TIMI risk score is 5, which indicates a  26% risk of all cause mortality, new or recurrent myocardial infarction or need for urgent revascularization in the next 14 days.          For questions or updates, please contact [place] Please consult [email or web address] for contact info under    Signed, Charlie Pitter, PA-C  08/21/2022 8:38 AM  Attending note Patient seen and discussed with PA Dunn, I agree with her documentation. 72 yo male history of CAD s/p PCI in 2000, 5V CABG  LIMA to LAD, SVG to OM 2, OM 3, D1, PDA and recent cath as reported without PCI targets. History of HTN, DM2 CKD 3 presents with chest pain   WBC 13 Hgb 12.6 Plt 251 K 3.4 Cr 1.52 BUN 28 Hgb A1c 8.8  Trop 51-->169-->517-->552-->482 CXR no acute process  EKG SR chronic ST/T changes   Jan 2024 echo: LVEF 55-60%, grade II dd, apical akinesis with mural thrombus  Jan 2024 Stress: Findings are consistent with inferior infarction with moderate to high amount of peri-infarct ischemia. 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 The study is intermediate to high risk based on degree of inferior peri-infarct ischemia and decreased LVEF     Feb 2024 cath Severe three vessel CAD s/p 5V CABG with 4/5 patent bypass grafts Chronic occlusion mid LAD. The mid and distal  LAD fills from the patent LIMA graft.The Diagonal Shakendra Griffeth fills from the patent vein graft The Circumflex is totally occluded in the mid segment. The sequential vein graft to the second and third OM branches is patent to the second OM Val Schiavo but the distal limb of this graft to the small third OM Kouper Spinella is occluded. There is collateral filling of this third OM Frans Valente from collaterals supplied through the SVG to Diagonal.  The large dominant RCA is occluded in the mid segment. Patent vein graft to the distal RCA   Recommendations: No focal targets for PCI. I suspect that his recent change in symptoms is related to the occlusion of the distal limb of the sequential vein graft to the most distal OM Dilraj Killgore. This small Mariana Goytia fills from left to left collaterals. Continue medical management of CAD.     1.NSTEMI/CAD - history of CAD s/p PCI in 2000, 5V CABG  LIMA to LAD, SVG to OM 2, OM 3, D1, PDA - 08/2022 cath: mid LAD occluded, D2 occluded, LCX mid occlusion, RCA mid occlusion. 4/5 patent bypass grafts, distal limb of sequential SVG most distal OM Karlynn Furrow is nearly occluded, this area had collaterals. No PCI targets. I suspect subtotal occluded graft Abhimanyu Cruces progressed to complete occlusion.  -peak trop 552 trending down. EKG SR chronic ST/T changes  - recent cath without PCI targets. Plans for medical management of NSTEMI - medical therapy with fondaparinux (alpha gal and pork allergy, recs per pharmacy for fondaparinux). ASA 81, atorva 80, benazepril 40, toprol 100. Change NG patch to imdur 60mg  daily.  - plan 48 hrs anticoagulation, then restart eliquis. Given ACS will add plavix to his asa, at discharge would plan for eliquis and plavix without asa.  - check limited echo reasses LV function and wall motion   2. LV thrombus - noted on Jan 2024 echo - eliquis on hold while on fondaparinux, restart eliquis at discharge   Carlyle Dolly MD

## 2022-08-22 ENCOUNTER — Encounter (HOSPITAL_COMMUNITY): Payer: Self-pay | Admitting: Family Medicine

## 2022-08-22 ENCOUNTER — Telehealth: Payer: Self-pay | Admitting: Internal Medicine

## 2022-08-22 ENCOUNTER — Telehealth: Payer: Self-pay | Admitting: Physician Assistant

## 2022-08-22 DIAGNOSIS — I214 Non-ST elevation (NSTEMI) myocardial infarction: Secondary | ICD-10-CM | POA: Diagnosis not present

## 2022-08-22 LAB — LIPID PANEL
Cholesterol: 92 mg/dL (ref 0–200)
HDL: 21 mg/dL — ABNORMAL LOW (ref >40)
LDL Cholesterol: 48 mg/dL (ref 0–99)
Total CHOL/HDL Ratio: 4.4 RATIO
Triglycerides: 117 mg/dL (ref <150)
VLDL: 23 mg/dL (ref 0–40)

## 2022-08-22 LAB — CBC
HCT: 37.1 % — ABNORMAL LOW (ref 39.0–52.0)
Hemoglobin: 12.5 g/dL — ABNORMAL LOW (ref 13.0–17.0)
MCH: 30 pg (ref 26.0–34.0)
MCHC: 33.7 g/dL (ref 30.0–36.0)
MCV: 89 fL (ref 80.0–100.0)
Platelets: 229 10*3/uL (ref 150–400)
RBC: 4.17 MIL/uL — ABNORMAL LOW (ref 4.22–5.81)
RDW: 12.7 % (ref 11.5–15.5)
WBC: 12 10*3/uL — ABNORMAL HIGH (ref 4.0–10.5)
nRBC: 0 % (ref 0.0–0.2)

## 2022-08-22 LAB — BASIC METABOLIC PANEL
Anion gap: 11 (ref 5–15)
BUN: 34 mg/dL — ABNORMAL HIGH (ref 8–23)
CO2: 24 mmol/L (ref 22–32)
Calcium: 8.6 mg/dL — ABNORMAL LOW (ref 8.9–10.3)
Chloride: 103 mmol/L (ref 98–111)
Creatinine, Ser: 1.64 mg/dL — ABNORMAL HIGH (ref 0.61–1.24)
GFR, Estimated: 44 mL/min — ABNORMAL LOW (ref >60)
Glucose, Bld: 144 mg/dL — ABNORMAL HIGH (ref 70–99)
Potassium: 3.5 mmol/L (ref 3.5–5.1)
Sodium: 138 mmol/L (ref 135–145)

## 2022-08-22 LAB — GLUCOSE, CAPILLARY: Glucose-Capillary: 243 mg/dL — ABNORMAL HIGH (ref 70–99)

## 2022-08-22 MED ORDER — APIXABAN 5 MG PO TABS: 5.0000 mg | ORAL_TABLET | Freq: Two times a day (BID) | ORAL | Status: DC

## 2022-08-22 MED ORDER — PANTOPRAZOLE SODIUM 40 MG PO TBEC: 40.0000 mg | DELAYED_RELEASE_TABLET | Freq: Every day | ORAL | 1 refills | Status: DC

## 2022-08-22 MED ORDER — CLOPIDOGREL BISULFATE 75 MG PO TABS: 75.0000 mg | ORAL_TABLET | Freq: Every day | ORAL | 1 refills | Status: DC

## 2022-08-22 NOTE — Progress Notes (Signed)
Pt ambulated the unit with no complaints and no issues. Pt walked roughly 659ft. Pt discharged home with wife, pt wanted to walk out to vehicle with wife.

## 2022-08-22 NOTE — Progress Notes (Signed)
Rounding Note    Patient Name: Martin Hooper Date of Encounter: 08/22/2022  [facility] Cardiologist: Mallipeddi  Subjective   No chest pains  Inpatient Medications    Scheduled Meds:  amLODipine  5 mg Oral Daily   apixaban  5 mg Oral BID   aspirin EC  81 mg Oral Daily   atorvastatin  80 mg Oral Daily   benazepril  40 mg Oral Daily   clopidogrel  75 mg Oral Daily   hydrochlorothiazide  25 mg Oral Daily   insulin aspart  0-15 Units Subcutaneous TID WC   insulin aspart  0-5 Units Subcutaneous QHS   insulin glargine-yfgn  30 Units Subcutaneous QHS   isosorbide mononitrate  60 mg Oral Daily   metoprolol succinate  100 mg Oral Daily   pantoprazole  40 mg Oral Daily   Continuous Infusions:  PRN Meds: acetaminophen **OR** acetaminophen, morphine injection, ondansetron **OR** ondansetron (ZOFRAN) IV, polyethylene glycol   Vital Signs    Vitals:   08/21/22 1653 08/21/22 1654 08/21/22 2038 08/22/22 0426  BP: (!) 99/55 115/65 132/72 (!) 135/56  Pulse: (!) 50  (!) 44 (!) 51  Resp: 20  20 18   Temp: 98.6 F (37 C)  97.7 F (36.5 C) 98 F (36.7 C)  TempSrc: Oral  Oral Oral  SpO2: 96%  97% 96%  Weight:      Height:        Intake/Output Summary (Last 24 hours) at 08/22/2022 0949 Last data filed at 08/21/2022 1900 Gross per 24 hour  Intake 240 ml  Output --  Net 240 ml      08/20/2022    5:21 PM 08/20/2022    3:09 PM 08/20/2022   11:23 AM  Last 3 Weights  Weight (lbs) 202 lb 2.6 oz 208 lb 1.8 oz 208 lb 1.8 oz  Weight (kg) 91.7 kg 94.4 kg 94.4 kg      Telemetry    SR, sinus brady - Personally Reviewed  ECG    N/a - Personally Reviewed  Physical Exam   GEN: No acute distress.   Neck: No JVD Cardiac: RRR, no murmurs, rubs, or gallops.  Respiratory: Clear to auscultation bilaterally. GI: Soft, nontender, non-distended  MS: No edema; No deformity. Neuro:  Nonfocal  Psych: Normal affect   Labs    High Sensitivity Troponin:   Recent Labs  Lab  08/20/22 1841 08/20/22 2145 08/21/22 0000 08/21/22 0202 08/21/22 0617  TROPONINIHS 517* 552* 482* 431* 312*     Chemistry Recent Labs  Lab 08/20/22 1131 08/21/22 0832 08/22/22 0513  NA 133* 140 138  K 3.9 3.6 3.5  CL 99 104 103  CO2 24 28 24   GLUCOSE 383* 121* 144*  BUN 28* 25* 34*  CREATININE 1.54* 1.52* 1.64*  CALCIUM 8.9 9.0 8.6*  PROT 6.3*  --   --   ALBUMIN 3.5  --   --   AST 23  --   --   ALT 22  --   --   ALKPHOS 52  --   --   BILITOT 0.6  --   --   GFRNONAA 48* 49* 44*  ANIONGAP 10 8 11     Lipids  Recent Labs  Lab 08/22/22 0513  CHOL 92  TRIG 117  HDL 21*  LDLCALC 48  CHOLHDL 4.4    Hematology Recent Labs  Lab 08/20/22 1131 08/21/22 0832 08/22/22 0513  WBC 14.4* 12.5* 12.0*  RBC 4.43 4.49 4.17*  HGB 12.8*  13.3 12.5*  HCT 39.5 39.9 37.1*  MCV 89.2 88.9 89.0  MCH 28.9 29.6 30.0  MCHC 32.4 33.3 33.7  RDW 12.7 12.6 12.7  PLT 249 266 229   Thyroid  Recent Labs  Lab 08/21/22 0832  TSH 2.382    BNPNo results for input(s): "BNP", "PROBNP" in the last 168 hours.  DDimer No results for input(s): "DDIMER" in the last 168 hours.   Radiology    ECHOCARDIOGRAM LIMITED  Result Date: 08/21/2022    ECHOCARDIOGRAM LIMITED REPORT   Patient Name:   Martin Hooper [facility] Date of Exam: 08/21/2022 Medical Rec #:  [id number]         Height:       68.0 in Accession #:    [id number]        Weight:       202.2 lb Date of Birth:  03/10/1951         BSA:          2.053 m Patient Age:    36 years          BP:           118/67 mmHg Patient Gender: M                 HR:           46 bpm. Exam [facility]:  [facility] Procedure: Limited Echo and Intracardiac Opacification Agent Indications:    Chest Pain R07.9  History:        Patient has prior history of Echocardiogram examinations, most                 recent 07/20/2022. Thrombus and Cardiomyopathy, CAD, NSTEMI and                 Previous Myocardial Infarction, Prior CABG; Risk                 Factors:Hypertension, Dyslipidemia,  Diabetes and Non-Smoker.  Sonographer:    Greer Pickerel Referring Phys: [id number] Alphonse Guild Thi Sisemore  Sonographer Comments: Image acquisition challenging due to respiratory motion. IMPRESSIONS  1. Apex is akinetic. Small apical thrombus is noted similar to prior study. . Left ventricular ejection fraction, by estimation, is 55 to 60%. The left ventricle has normal function.  2. Limited study to evaluate LV function. FINDINGS  Left Ventricle: Apex is akinetic. Small apical thrombus is noted similar to prior study. Left ventricular ejection fraction, by estimation, is 55 to 60%. The left ventricle has normal function. Definity contrast agent was given IV to delineate the left ventricular endocardial borders. LEFT VENTRICLE PLAX 2D LVIDd:         5.40 cm LVIDs:         3.80 cm LV PW:         1.30 cm LV IVS:        1.30 cm  LEFT ATRIUM         Index LA diam:    4.00 cm 1.95 cm/m Carlyle Dolly MD Electronically signed by Carlyle Dolly MD Signature Date/Time: 08/21/2022/4:22:30 PM    Final    DG Chest Port 1 View  Result Date: 08/20/2022 CLINICAL DATA:  Chest pain EXAM: PORTABLE CHEST 1 VIEW COMPARISON:  Prior chest x-ray 07/03/2022 FINDINGS: Prior surgical changes of median sternotomy with evidence of multivessel CABG. Metallic stent projects over the LAD. Cardiac and mediastinal contours are unchanged. No pneumothorax, pleural effusion or evidence of pulmonary edema. No acute osseous abnormality. IMPRESSION: Stable chest x-ray  without evidence of acute cardiopulmonary process. Electronically Signed   By: Jacqulynn Cadet M.D.   On: 08/20/2022 12:06    Cardiac Studies     Patient Profile     Martin Hooper is a 72 y.o. 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 Roger Shelter.   Assessment & Plan    1.NSTEMI/CAD - history of CAD s/p PCI in 2000, 5V CABG  LIMA to LAD, SVG to OM 2, OM 3, D1, PDA - 08/2022 cath: mid LAD occluded, D2 occluded, LCX mid occlusion, RCA mid occlusion. 4/5 patent bypass grafts, distal limb of sequential SVG most distal OM [place] Martin is nearly occluded, this area had collaterals. No PCI targets. I suspect subtotal occluded graft Laveah Gloster progressed to complete occlusion.  -peak trop 552 trending down. EKG SR chronic ST/T changes -echo limited LVEF 55-60%, apical akineis with ongoing apical thrombus   - recent cath without PCI targets. Plans for medical management of NSTEMI - medical therapy with fondaparinux (alpha gal and pork allergy, recs per pharmacy for fondaparinux). ASA 81, plavix 75, atorva 80, benazepril 40, toprol 100, imdur 60mg   - completed 48 hrs of anticoagulation. D/c fondaparinux, restart his home eliquis. D/c aspirin, continue plavix. Plan would be eliquis and plavix at discharge. Since on plavix home omeprazole should be protonix.       2. LV thrombus - noted on Jan 2024 echo, also noted on repeat limited echo this admit - eliquis on hold while on fondaparinux,will restart today  Needs work excuse until Feb 19  For questions or updates, please contact [place] Please consult [email or web address] for contact info under        Signed, Carlyle Dolly, MD  08/22/2022, 9:49 AM

## 2022-08-22 NOTE — Telephone Encounter (Signed)
   Attention TOC pool,  This patient will need a TOC phone call after discharge. They are being discharged today. Follow-up appointment has already been arranged with: moved up previous appt to 2/20 with Dr. Dellia Cloud They are a patient of Vishnu Olene Craven, MD.  Thank you! Charlie Pitter, PA-C

## 2022-08-22 NOTE — Discharge Summary (Signed)
Physician Discharge Summary  Martin Hooper TML:[id number] DOB: 1951-06-17 DOA: 08/20/2022  PCP: Caryl Bis, MD  Admit date: 08/20/2022  Discharge date: 08/22/2022  Admitted From:Home  Disposition:  Home  Recommendations for Outpatient Follow-up:  Follow up with PCP in 4 weeks Follow-up with cardiology Dr. Dineen Kid on 2/20 as scheduled. Continue on Eliquis and Plavix as recommended per cardiology Switch omeprazole to pantoprazole Continue other home medications as prior with no further use of aspirin  Home Health: None  Equipment/Devices: None  Discharge Condition:Stable  CODE STATUS: Full  Diet recommendation: Heart Healthy/carb modified  Brief/Interim Summary: Martin Hooper is a 72 y.o. male with medical history significant for CABG 2013 and PCI 2000, LV thrombus, hypertension, diabetes mellitus. Patient presented to the ED with complaints of chest pain that was evaluated by cardiology with noted NSTEMI.  2D echocardiogram as noted below with LVEF 55-60% with apical akinesis and ongoing apical thrombus.  Recommendations are for medical management of NSTEMI and patient will discharge on home Eliquis as well as Plavix.  During his hospitalization he was treated with 48 hours of fondaparinux due to alpha gal and pork allergy.  He will have close follow-up with cardiology as noted above on 2/20 with Dr. Dellia Cloud.  Discharge Diagnoses:  Principal Problem:   NSTEMI (non-ST elevated myocardial infarction) ([place]) Active Problems:   HTN, goal below 130/80   HLD (hyperlipidemia)   LV (left ventricular) mural thrombus   Coronary artery disease of native artery of native heart with stable angina pectoris ([place])   Diabetes ([place])   Stage 3a chronic kidney disease (CKD) ([place])  Principal discharge diagnosis: NSTEMI/CAD with LV thrombus.  Discharge Instructions  Discharge Instructions     Diet - low sodium heart healthy   Complete by: As directed    Increase activity slowly    Complete by: As directed       Allergies as of 08/22/2022       Reactions   Alpha-gal Anaphylaxis   Any mammal meat    Beef (bovine) Protein Anaphylaxis   Pork-derived Products Anaphylaxis        Medication List     STOP taking these medications    aspirin EC 325 MG tablet   esomeprazole 20 MG capsule Commonly known as: NEXIUM Replaced by: pantoprazole 40 MG tablet       TAKE these medications    amLODipine 5 MG tablet Commonly known as: NORVASC Take 1 tablet (5 mg total) by mouth daily.   apixaban 5 MG Tabs tablet Commonly known as: Eliquis Take 1 tablet (5 mg total) by mouth 2 (two) times daily.   atorvastatin 80 MG tablet Commonly known as: LIPITOR Take 80 mg by mouth daily.   benazepril 40 MG tablet Commonly known as: LOTENSIN Take 40 mg by mouth daily.   Cholecalciferol 125 MCG (5000 UT) Tabs Take 5,000 Units by mouth daily.   clopidogrel 75 MG tablet Commonly known as: PLAVIX Take 1 tablet (75 mg total) by mouth daily. Start taking on: August 23, 2022   Coenzyme Q10 100 MG Tabs Take 100 mg by mouth daily.   diphenhydrAMINE 25 MG tablet Commonly known as: BENADRYL Take 25 mg by mouth at bedtime.   ferrous sulfate 325 (65 FE) MG tablet Take 325 mg by mouth daily with breakfast.   Fish Oil 1200 MG Caps Take 1,200 mg by mouth daily.   hydrochlorothiazide 25 MG tablet Commonly known as: HYDRODIURIL Take 25 mg by mouth daily.   isosorbide  mononitrate 60 MG 24 hr tablet Commonly known as: IMDUR Take 1 tablet (60 mg total) by mouth daily.   metFORMIN 500 MG 24 hr tablet Commonly known as: GLUCOPHAGE-XR Take 500-1,000 mg by mouth See admin instructions. Take 1000mg  in the AM and 500mg  in the PM.   metoprolol succinate 100 MG 24 hr tablet Commonly known as: TOPROL-XL Take 100 mg by mouth daily.   nitroGLYCERIN 0.4 MG SL tablet Commonly known as: NITROSTAT Place 1 tablet (0.4 mg total) under the tongue every 5 (five) minutes as needed  for chest pain.   pantoprazole 40 MG tablet Commonly known as: PROTONIX Take 1 tablet (40 mg total) by mouth daily. Start taking on: August 23, 2022 Replaces: esomeprazole 20 MG capsule   tamsulosin 0.4 MG Caps capsule Commonly known as: FLOMAX Take 0.4 mg by mouth in the morning and at bedtime.   tiZANidine 4 MG tablet Commonly known as: ZANAFLEX Take 2 mg by mouth every 6 (six) hours as needed for muscle spasms.   [name] FlexTouch 200 UNIT/ML FlexTouch Pen Generic drug: insulin degludec Inject 68 Units into the skin at bedtime.   vitamin C 1000 MG tablet Take 1,000 mg by mouth daily.   Zinc 30 MG Caps Take 30 mg by mouth daily.        Follow-up Information     Mallipeddi, Vishnu P, MD Follow up.   Specialties: Cardiology, Internal Medicine Why: [facility] [place] location - we moved up your follow-up appointment to Tuesday Sep 04, 2022 at 8:00 AM (Arrive by 7:45 AM). Contact information: [address] [place] [address] [phone]         Caryl Bis, MD. Schedule an appointment as soon as possible for a visit in 4 week(s).   Specialty: Family Medicine Contact information: [place] [address] [phone]                Allergies  Allergen Reactions   Alpha-Gal Anaphylaxis    Any mammal meat    Beef (Bovine) Protein Anaphylaxis   Pork-Derived Products Anaphylaxis    Consultations: Cardiology   Procedures/Studies: ECHOCARDIOGRAM LIMITED  Result Date: 08/21/2022    ECHOCARDIOGRAM LIMITED REPORT   Patient Name:   Martin Hooper [facility] Date of Exam: 08/21/2022 Medical Rec #:  [id number]         Height:       68.0 in Accession #:    [id number]        Weight:       202.2 lb Date of Birth:  05/18/1951         BSA:          2.053 m Patient Age:    64 years          BP:           118/67 mmHg Patient Gender: M                 HR:           46 bpm. Exam Location:  [facility] Procedure: Limited Echo and Intracardiac Opacification  Agent Indications:    Chest Pain R07.9  History:        Patient has prior history of Echocardiogram examinations, most                 recent 07/20/2022. Thrombus and Cardiomyopathy, CAD, NSTEMI and  Previous Myocardial Infarction, Prior CABG; Risk                 Factors:Hypertension, Dyslipidemia, Diabetes and Non-Smoker.  Sonographer:    Greer Pickerel Referring Phys: [id number] Alphonse Guild BRANCH  Sonographer Comments: Image acquisition challenging due to respiratory motion. IMPRESSIONS  1. Apex is akinetic. Small apical thrombus is noted similar to prior study. . Left ventricular ejection fraction, by estimation, is 55 to 60%. The left ventricle has normal function.  2. Limited study to evaluate LV function. FINDINGS  Left Ventricle: Apex is akinetic. Small apical thrombus is noted similar to prior study. Left ventricular ejection fraction, by estimation, is 55 to 60%. The left ventricle has normal function. Definity contrast agent was given IV to delineate the left ventricular endocardial borders. LEFT VENTRICLE PLAX 2D LVIDd:         5.40 cm LVIDs:         3.80 cm LV PW:         1.30 cm LV IVS:        1.30 cm  LEFT ATRIUM         Index LA diam:    4.00 cm 1.95 cm/m Carlyle Dolly MD Electronically signed by Carlyle Dolly MD Signature Date/Time: 08/21/2022/4:22:30 PM    Final    DG Chest Port 1 View  Result Date: 08/20/2022 CLINICAL DATA:  Chest pain EXAM: PORTABLE CHEST 1 VIEW COMPARISON:  Prior chest x-ray 07/03/2022 FINDINGS: Prior surgical changes of median sternotomy with evidence of multivessel CABG. Metallic stent projects over the LAD. Cardiac and mediastinal contours are unchanged. No pneumothorax, pleural effusion or evidence of pulmonary edema. No acute osseous abnormality. IMPRESSION: Stable chest x-ray without evidence of acute cardiopulmonary process. Electronically Signed   By: Jacqulynn Cadet M.D.   On: 08/20/2022 12:06   CARDIAC CATHETERIZATION  Result Date: 08/16/2022   Prox  RCA lesion is 50% stenosed.   Mid RCA lesion is 100% stenosed.   2nd Diag lesion is 100% stenosed.   Mid Cx lesion is 100% stenosed.   Mid Graft to Insertion lesion between 2nd Mrg and 3rd Mrg  is 99% stenosed.   Mid LAD lesion is 100% stenosed.   SVG graft was visualized by angiography.   SVG graft was visualized by angiography.   LIMA graft was visualized by non-selective angiography. Severe three vessel CAD s/p 5V CABG with 4/5 patent bypass grafts Chronic occlusion mid LAD. The mid and distal LAD fills from the patent LIMA graft.The Diagonal branch fills from the patent vein graft The Circumflex is totally occluded in the mid segment. The sequential vein graft to the second and third OM branches is patent to the second OM branch but the distal limb of this graft to the small third OM branch is occluded. There is collateral filling of this third OM branch from collaterals supplied through the SVG to Diagonal. The large dominant RCA is occluded in the mid segment. Patent vein graft to the distal RCA Recommendations: No focal targets for PCI. I suspect that his recent change in symptoms is related to the occlusion of the distal limb of the sequential vein graft to the most distal OM branch. This small branch fills from left to left collaterals. Continue medical management of CAD.   NM Myocar Multi W/Spect W/Wall Motion / EF  Result Date: 08/06/2022   Findings are consistent with inferior infarction with moderate to high amount of peri-infarct ischemia. 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 The study is intermediate to high  risk based on degree of inferior peri-infarct ischemia and decreased LVEF   No ST deviation was noted.   LV perfusion is abnormal. Large moderate to severe intensity inferior defect with moderate to high level of reversibility. Large severe intensity fixed apical defect.   Left ventricular function is abnormal. Nuclear stress EF: 47 %. The left ventricular ejection fraction is mildly decreased (45-54%). End  diastolic cavity size is mildly enlarged.     Discharge Exam: Vitals:   08/21/22 2038 08/22/22 0426  BP: 132/72 (!) 135/56  Pulse: (!) 44 (!) 51  Resp: 20 18  Temp: 97.7 F (36.5 C) 98 F (36.7 C)  SpO2: 97% 96%   Vitals:   08/21/22 1653 08/21/22 1654 08/21/22 2038 08/22/22 0426  BP: (!) 99/55 115/65 132/72 (!) 135/56  Pulse: (!) 50  (!) 44 (!) 51  Resp: 20  20 18   Temp: 98.6 F (37 C)  97.7 F (36.5 C) 98 F (36.7 C)  TempSrc: Oral  Oral Oral  SpO2: 96%  97% 96%  Weight:      Height:        General: Pt is alert, awake, not in acute distress Cardiovascular: RRR, S1/S2 +, no rubs, no gallops Respiratory: CTA bilaterally, no wheezing, no rhonchi Abdominal: Soft, NT, ND, bowel sounds + Extremities: no edema, no cyanosis    The results of significant diagnostics from this hospitalization (including imaging, microbiology, ancillary and laboratory) are listed below for reference.     Microbiology: No results found for this or any previous visit (from the past 240 hour(s)).   Labs: BNP (last 3 results) Recent Labs    07/02/22 1301  BNP 73.4   Basic Metabolic Panel: Recent Labs  Lab 08/20/22 0000 08/20/22 1131 08/21/22 0832 08/22/22 0513  NA 136 133* 140 138  K 3.4* 3.9 3.6 3.5  CL 102 99 104 103  CO2 26 24 28 24   GLUCOSE 248* 383* 121* 144*  BUN 28* 28* 25* 34*  CREATININE 1.52* 1.54* 1.52* 1.64*  CALCIUM 8.6* 8.9 9.0 8.6*   Liver Function Tests: Recent Labs  Lab 08/20/22 1131  AST 23  ALT 22  ALKPHOS 52  BILITOT 0.6  PROT 6.3*  ALBUMIN 3.5   No results for input(s): "LIPASE", "AMYLASE" in the last 168 hours. No results for input(s): "AMMONIA" in the last 168 hours. CBC: Recent Labs  Lab 08/20/22 0000 08/20/22 1131 08/21/22 0832 08/22/22 0513  WBC 13.0* 14.4* 12.5* 12.0*  NEUTROABS  --  11.6*  --   --   HGB 12.6* 12.8* 13.3 12.5*  HCT 37.1* 39.5 39.9 37.1*  MCV 87.3 89.2 88.9 89.0  PLT 251 249 266 229   Cardiac Enzymes: No results  for input(s): "CKTOTAL", "CKMB", "CKMBINDEX", "TROPONINI" in the last 168 hours. BNP: Invalid input(s): "POCBNP" CBG: Recent Labs  Lab 08/21/22 0753 08/21/22 1230 08/21/22 1654 08/21/22 2040 08/22/22 0728  GLUCAP 123* 374* 294* 233* 243*   D-Dimer No results for input(s): "DDIMER" in the last 72 hours. Hgb A1c Recent Labs    08/20/22 1841  HGBA1C 8.8*   Lipid Profile Recent Labs    08/22/22 0513  CHOL 92  HDL 21*  LDLCALC 48  TRIG 117  CHOLHDL 4.4   Thyroid function studies Recent Labs    08/21/22 0832  TSH 2.382   Anemia work up No results for input(s): "VITAMINB12", "FOLATE", "FERRITIN", "TIBC", "IRON", "RETICCTPCT" in the last 72 hours. Urinalysis No results found for: "COLORURINE", "APPEARANCEUR", "LABSPEC", "PHURINE", "GLUCOSEU", "HGBUR", "  BILIRUBINUR", "KETONESUR", "PROTEINUR", "UROBILINOGEN", "NITRITE", "LEUKOCYTESUR" Sepsis Labs Recent Labs  Lab 08/20/22 0000 08/20/22 1131 08/21/22 0832 08/22/22 0513  WBC 13.0* 14.4* 12.5* 12.0*   Microbiology No results found for this or any previous visit (from the past 240 hour(s)).   Time coordinating discharge: 35 minutes  SIGNED:   Erick Blinks, DO [facility] 08/22/2022, 10:41 AM  If 7PM-7AM, please contact night-coverage [email or web address]

## 2022-08-22 NOTE — Inpatient Diabetes Management (Signed)
Inpatient Diabetes Program Recommendations  AACE/ADA: New Consensus Statement on Inpatient Glycemic Control (2015)  Target Ranges:  Prepandial:   less than 140 mg/dL      Peak postprandial:   less than 180 mg/dL (1-2 hours)      Critically ill patients:  140 - 180 mg/dL   Lab Results  Component Value Date   GLUCAP 243 (H) 08/22/2022   HGBA1C 8.8 (H) 08/20/2022    Review of Glycemic Control  Latest Reference Range & Units 08/21/22 12:30 08/21/22 16:54 08/21/22 20:40 08/22/22 07:28  Glucose-Capillary 70 - 99 mg/dL 374 (H) 294 (H) 233 (H) 243 (H)  (H): Data is abnormally high Diabetes history: Type 2 DM Outpatient Diabetes medications: Tresiba 68 units QHS, Metformin 1000 mg QA, 500 mg QP Current orders for Inpatient glycemic control: Novolog 0-15 units TID, Novolog 0-5 units QHS, Semglee 30 units QHS  Inpatient Diabetes Program Recommendations:   If to remain inpatient: Consider adding Novolog 4 units TID (Assuming patient consuming >50% of meals).   Thanks, Bronson Curb, MSN, RNC-OB Diabetes Coordinator [phone] (8a-5p)

## 2022-08-22 NOTE — Telephone Encounter (Signed)
Patient contacted regarding discharge from [facility] on 08/22/2022.  Patient understands to follow up with provider Vishnu Malliipeddi on 09/04/2022 at 8 am at [facility]. Patient understands discharge instructions? Yes Patient understands medications and regiment? Yes Patient understands to bring all medications to this visit? Yes  Ask patient:  Are you enrolled in My Chart Yes.

## 2022-08-30 ENCOUNTER — Encounter: Payer: Self-pay | Admitting: Medical

## 2022-08-30 ENCOUNTER — Ambulatory Visit: Payer: BC Managed Care – PPO | Attending: Internal Medicine | Admitting: Medical

## 2022-08-30 VITALS — BP 120/72 | HR 60 | Ht 68.0 in | Wt 206.0 lb

## 2022-08-30 DIAGNOSIS — Z79899 Other long term (current) drug therapy: Secondary | ICD-10-CM

## 2022-08-30 DIAGNOSIS — I513 Intracardiac thrombosis, not elsewhere classified: Secondary | ICD-10-CM

## 2022-08-30 DIAGNOSIS — I214 Non-ST elevation (NSTEMI) myocardial infarction: Secondary | ICD-10-CM | POA: Diagnosis not present

## 2022-08-30 DIAGNOSIS — I2510 Atherosclerotic heart disease of native coronary artery without angina pectoris: Secondary | ICD-10-CM

## 2022-08-30 DIAGNOSIS — E782 Mixed hyperlipidemia: Secondary | ICD-10-CM

## 2022-08-30 NOTE — Patient Instructions (Addendum)
Medication Instructions:  Your physician recommends that you continue on your current medications as directed. Please refer to the Current Medication list given to you today.  *If you need a refill on your cardiac medications before your next appointment, please call your pharmacy*  *Work Note provided today Lab Work: Your physician recommends that you return for lab work in: Today   If you have labs (blood work) drawn today and your tests are completely normal, you will receive your results only by: [employer] (if you have MyChart) OR A paper copy in the mail If you have any lab test that is abnormal or we need to change your treatment, we will call you to review the results.   Testing/Procedures: NONE    Follow-Up: At [facility], you and your health needs are our priority.  As part of our continuing mission to provide you with exceptional heart care, we have created designated Provider Care Teams.  These Care Teams include your primary Cardiologist (physician) and Advanced Practice Providers (APPs -  Physician Assistants and Nurse Practitioners) who all work together to provide you with the care you need, when you need it.  We recommend signing up for the patient portal called "MyChart".  Sign up information is provided on this After Visit Summary.  MyChart is used to connect with patients for Virtual Visits (Telemedicine).  Patients are able to view lab/test results, encounter notes, upcoming appointments, etc.  Non-urgent messages can be sent to your provider as well.   To learn more about what you can do with MyChart, go to [email or web address].    Your next appointment:   2 month(s)  Provider:   Carlyle Dolly, MD    Other Instructions Thank you for choosing [place]!

## 2022-08-30 NOTE — Progress Notes (Signed)
Cardiology Office Note:    Date:  08/30/2022   ID:  Martin, Hooper 1951/04/11, MRN [id number]  PCP:  Caryl Bis, MD  [facility] Cardiologist:  Carlyle Dolly, MD  [facility] Electrophysiologist:  None   Referring MD: Caryl Bis, MD   Chief Complaint: Hospital follow-up  History of Present Illness:    Martin Hooper is a 72 y.o. male with a hx of CAD s/p PCI in 2000, 5V CABG (LIMA to LAD, SVG to OM 2, OM 3, D1, PDA) in 2013 with LVEF 45% and apical akinesis at that time, recent echo 07/2022 with EF 55-60% but + LV thrombus, HTN, DM2, CKD stage IIIa, baseline sinus bradycardia on metoprolol, pork and alpha gal allergy who is being seen for hospital follow-up.   Martin Hooper was previously followed at [facility] before establishing care with Dr. Dellia Cloud in 06/2022 for recurrent chest pain. Echo and stress test were ordered. Echo 07/20/22 showed LE 55-60%, G2DD, apical LV akinetic/aneurysmal segment with rounded and somewhat calcified 1x1cm mural thrombus, mild MR. Aspirin was discontinued and he was started on Eliquis. CRP was 1, ESR wnl and Ddimer 0.70. Stress test was abnormal. At follow-up he was started on Imdur 56m daily with amlodipine dose reduced from 173mto 53m42maily. He underwent cath 08/2022 showing severe 3V native disease with 4/5 patent bypass grafts- the sequential vein graft to the second and third OM branches was patent to the second OM branch but the distal limb of this graft to the small third OM branch was occluded, with collateral filling of his third OM branch from collaterals supplied through the SVG to Diag. There were no focal targets for PCI. Dr. McAAngelena Formspected the recent change in symptoms was related to the occlusion of the distal limb of the sequential vein graft to the most distal OM branch, and continued medical therapy was recommended.   The patient was recently admitted 2/5-2/7 for NSTEMI. Echo showed LVEF 55-60% with apical akinesis and  ongoing apical thrombus. He was treated with 48 hours of fondaparinux due to alpha gal and pork allergy. Recommendations were for medical management of NSTEMI and patient was discharged on Plavix and Eliquis.   Today, the patient reports he has been doing well since being home. He has been taking it easy and not doing much. He denies chest pain, SOB, LLE, orthopnea or pnd. He is taking Plavix and Eliquis. 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 The patient is agreeable to cardiac rehab.   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   apixaban (ELIQUIS) 5 MG TABS tablet Take 1 tablet (5 mg total) by mouth 2 (two) times daily.   Ascorbic Acid (VITAMIN C) 1000 MG tablet Take 1,000 mg by mouth daily.   atorvastatin (LIPITOR) 80 MG tablet Take 80 mg by mouth daily.   benazepril (LOTENSIN) 40 MG tablet Take 40 mg by mouth daily.   Cholecalciferol 125 MCG (5000 UT) TABS Take 5,000 Units by mouth daily.   clopidogrel (PLAVIX) 75 MG tablet Take 1 tablet (75 mg total) by mouth daily.   Coenzyme Q10 100 MG TABS Take 100 mg by mouth daily.   diphenhydrAMINE (BENADRYL) 25 MG tablet Take 25 mg by mouth at bedtime.   ferrous sulfate 325 (65  FE) MG tablet Take 325 mg by mouth daily with breakfast.   hydrochlorothiazide (HYDRODIURIL) 25 MG tablet Take 25 mg by mouth daily.   isosorbide mononitrate (IMDUR) 60 MG 24 hr tablet Take 1 tablet (60 mg total) by mouth daily.   metFORMIN (GLUCOPHAGE-XR) 500 MG 24 hr tablet Take 500-1,000 mg by mouth See admin instructions. Take 1036m in the AM and 5035min the PM.   metoprolol succinate (TOPROL-XL) 100 MG 24 hr tablet Take 100 mg by mouth daily.   nitroGLYCERIN (NITROSTAT) 0.4 MG SL tablet Place 1 tablet (0.4 mg total) under the tongue every 5 (five) minutes as needed for chest pain.   Omega-3 Fatty Acids (FISH OIL) 1200 MG CAPS Take 1,200 mg by mouth daily.   pantoprazole (PROTONIX) 40 MG tablet Take 1 tablet (40 mg total) by mouth daily.   tamsulosin (FLOMAX) 0.4 MG CAPS capsule Take 0.4 mg by mouth in the morning and at bedtime.   tiZANidine (ZANAFLEX) 4 MG tablet Take 2 mg by mouth every 6 (six) hours as needed for muscle spasms.   TRESIBA FLEXTOUCH 200 UNIT/ML FlexTouch Pen Inject 68 Units into the skin at bedtime.   Zinc 30 MG CAPS Take 30 mg by mouth daily.     Allergies:   Alpha-gal, Beef (bovine) protein, and Pork-derived products   Social History   Socioeconomic History   Marital status: Married    Spouse name: Not on file   Number of children: Not on file   Years of education: Not on file   Highest education level: Not on file  Occupational History   Not on file  Tobacco Use   Smoking status: Never   Smokeless tobacco: Never  Vaping Use   Vaping Use: Never used  Substance and Sexual Activity   Alcohol use: Never   Drug use: Never   Sexual activity: Not on file  Other Topics Concern   Not on file  Social History Narrative   Not on file   Social Determinants of Health   Financial Resource Strain: Not on file  Food Insecurity: No Food Insecurity (08/20/2022)   Hunger Vital Sign    Worried About Running Out of Food in the Last Year: Never true    Ran Out of Food  in the Last Year: Never true  Transportation Needs: No Transportation Needs (08/20/2022)   PRAPARE - TrHydrologistMedical): No    Lack of Transportation (Non-Medical): No  Physical Activity: Not on file  Stress: Not on file  Social Connections: Not on file     Family History: The patient's family history includes Cancer in his father; Diabetes in his mother; Schizophrenia in his mother.  ROS:   Please see the history of present  illness.     All other systems reviewed and are negative.  EKGs/Labs/Other Studies Reviewed:    The following studies were reviewed today:  Limited echo 08/2022  1. Apex is akinetic. Small apical thrombus is noted similar to prior  study. . Left ventricular ejection fraction, by estimation, is 55 to 60%.  The left ventricle has normal function.   2. Limited study to evaluate LV function.   Cardiac cath 08/16/22    Prox RCA lesion is 50% stenosed.   Mid RCA lesion is 100% stenosed.   2nd Diag lesion is 100% stenosed.   Mid Cx lesion is 100% stenosed.   Mid Graft to Insertion lesion between 2nd Mrg and 3rd Mrg  is 99% stenosed.   Mid LAD lesion is 100% stenosed.   SVG graft was visualized by angiography.   SVG graft was visualized by angiography.   LIMA graft was visualized by non-selective angiography.   Severe three vessel CAD s/p 5V CABG with 4/5 patent bypass grafts Chronic occlusion mid LAD. The mid and distal LAD fills from the patent LIMA graft.The Diagonal branch fills from the patent vein graft The Circumflex is totally occluded in the mid segment. The sequential vein graft to the second and third OM branches is patent to the second OM branch but the distal limb of this graft to the small third OM branch is occluded. There is collateral filling of this third OM branch from collaterals supplied through the SVG to Diagonal.  The large dominant RCA is occluded in the mid segment. Patent vein graft to the distal RCA    Recommendations: No focal targets for PCI. I suspect that his recent change in symptoms is related to the occlusion of the distal limb of the sequential vein graft to the most distal OM branch. This small branch fills from left to left collaterals. Continue medical management of CAD.   Echo 07/20/22  1. Left ventricular ejection fraction, by estimation, is 55 to 60%. The  left ventricle has normal function. The left ventricle demonstrates  regional wall motion abnormalities (see scoring diagram/findings for  description). There is mild concentric left  ventricular hypertrophy. Left ventricular diastolic parameters are  consistent with Grade II diastolic dysfunction (pseudonormalization).   2. Apical LV akinetic/aneurysmal segment with rounded and somewhat  calcified, 1 x 1 cm mural thrombus. Results reported to Dr. Dellia Cloud at  time of report.   3. Right ventricular systolic function is normal. The right ventricular  size is normal. Tricuspid regurgitation signal is inadequate for assessing  PA pressure.   4. Left atrial size was mildly dilated.   5. The mitral valve is grossly normal, mildly calcified. Mild mitral  valve regurgitation.   6. The aortic valve is tricuspid. Aortic valve regurgitation is not  visualized.   7. The inferior vena cava is normal in size with greater than 50%  respiratory variability, suggesting right atrial pressure of 3 mmHg.   Comparison(s): Prior images unable to be directly viewed.    EKG:  EKG is not ordered today.    Recent Labs: 07/02/2022: B Natriuretic Peptide 99.0 08/20/2022: ALT 22 08/21/2022: TSH 2.382 08/22/2022: BUN 34; Creatinine, Ser 1.64; Hemoglobin 12.5; Platelets 229; Potassium 3.5; Sodium 138  Recent Lipid Panel    Component Value Date/Time   CHOL 92 08/22/2022 0513   TRIG 117 08/22/2022 0513   HDL 21 (L) 08/22/2022 0513   CHOLHDL 4.4 08/22/2022 0513   VLDL 23 08/22/2022 0513   LDLCALC 48 08/22/2022 0513  Physical Exam:    VS:   BP 120/72   Pulse 60   Ht 5' 8"$  (1.727 m)   Wt 206 lb (93.4 kg)   SpO2 94%   BMI 31.32 kg/m     Wt Readings from Last 3 Encounters:  08/30/22 206 lb (93.4 kg)  08/20/22 202 lb 2.6 oz (91.7 kg)  08/16/22 208 lb 1.8 oz (94.4 kg)     GEN:  Well nourished, well developed in no acute distress HEENT: Normal NECK: No JVD; No carotid bruits LYMPHATICS: No lymphadenopathy CARDIAC: RRR, no murmurs, rubs, gallops RESPIRATORY:  Clear to auscultation without rales, wheezing or rhonchi  ABDOMEN: Soft, non-tender, non-distended MUSCULOSKELETAL:  No edema; No deformity  SKIN: Warm and dry NEUROLOGIC:  Alert and oriented x 3 PSYCHIATRIC:  Normal affect   ASSESSMENT:    1. NSTEMI (non-ST elevated myocardial infarction) ([place])   2. Medication management   3. Coronary artery disease involving native coronary artery of native heart without angina pectoris   4. LV (left ventricular) mural thrombus   5. Hyperlipidemia, mixed    PLAN:    In order of problems listed above:  NSTEMI/CAD s/o CABG x5 Recent admission for NSTEMI with HS trop up to 500 treated with 8 hours of fondaparinux due to alpha gal and pork allergy.  Patient just had cardiac cath prior to admission (2/1) showing mid LAD occlusion, D2 occlusion, Lcx occlusion, RCA mid occlusion, 4/5 patent bypass grafts, distal limb of sequential SVG most distal OM branch occluded, with collaterals. No PCI targets. It was suspected subtotal occluded graft branch progressed to complete. Echo showed normal LVEF with ongoing apical thrombus. Continue Plavix and Eliquis. NO bleeding issues reported, I will check a CBC. Continue Lipitor, Imdur, Benazepril, Toprol, and SL NTG. I will refer patient to cardiac rehab.   LV thrombus LV thrombus on echo 07/20/22. Echo during recent admission showed small apical thrombus. Continue Eliquis 86m BID. Plan to repeat echo in 6 months from time of dx.   HTN BP is good today, continue amlodipine, Benazepril, HCTZ,  Imdur and Toprol.   HLD LDL 48. Continue Lipitor 851mdaily.   Disposition: Follow up in 2 month(s) with MD    Signed, Lizzie An H Ninfa MeekerPA-C  08/30/2022 4:09 PM    [facility]

## 2022-08-31 LAB — CBC
Hematocrit: 36.4 % — ABNORMAL LOW (ref 37.5–51.0)
Hemoglobin: 12.4 g/dL — ABNORMAL LOW (ref 13.0–17.7)
MCH: 29.4 pg (ref 26.6–33.0)
MCHC: 34.1 g/dL (ref 31.5–35.7)
MCV: 86 fL (ref 79–97)
Platelets: 256 10*3/uL (ref 150–450)
RBC: 4.22 x10E6/uL (ref 4.14–5.80)
RDW: 12.6 % (ref 11.6–15.4)
WBC: 11.5 10*3/uL — ABNORMAL HIGH (ref 3.4–10.8)

## 2022-09-03 ENCOUNTER — Telehealth: Payer: Self-pay | Admitting: Cardiology

## 2022-09-03 DIAGNOSIS — Z0279 Encounter for issue of other medical certificate: Secondary | ICD-10-CM

## 2022-09-03 NOTE — Telephone Encounter (Signed)
Pt dropped off [employer] FMLA forms, signed release and forms paper work  Forms scanned into [employer] given to Dr. Harl Bowie

## 2022-09-04 ENCOUNTER — Ambulatory Visit: Payer: BC Managed Care – PPO | Admitting: Internal Medicine

## 2022-09-10 NOTE — Telephone Encounter (Signed)
Follow Up:       Patient is calling to check on the status of his paperwork.patient

## 2022-09-11 NOTE — Telephone Encounter (Signed)
Patient is calling back requesting update on forms. 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

## 2022-09-12 ENCOUNTER — Telehealth: Payer: Self-pay | Admitting: Cardiology

## 2022-09-12 NOTE — Telephone Encounter (Signed)
Spoke to [place] with [employer] and verbalized patients work note.

## 2022-09-12 NOTE — Telephone Encounter (Signed)
[employer] Short Term Disability is calling to get further information on patients return to work dates and diagnosis given. Please advise.

## 2022-09-13 ENCOUNTER — Encounter (HOSPITAL_COMMUNITY): Payer: Self-pay

## 2022-09-13 ENCOUNTER — Encounter (HOSPITAL_COMMUNITY)
Admission: RE | Admit: 2022-09-13 | Discharge: 2022-09-13 | Disposition: A | Discharge status: Home / Self Care | Payer: BC Managed Care – PPO | Source: Ambulatory Visit | Attending: Cardiology | Admitting: Cardiology

## 2022-09-13 VITALS — BP 110/60 | Ht 68.0 in | Wt 202.6 lb

## 2022-09-13 DIAGNOSIS — I214 Non-ST elevation (NSTEMI) myocardial infarction: Secondary | ICD-10-CM | POA: Diagnosis present

## 2022-09-13 LAB — GLUCOSE, CAPILLARY: Glucose-Capillary: 254 mg/dL — ABNORMAL HIGH (ref 70–99)

## 2022-09-13 NOTE — Progress Notes (Signed)
Cardiac Individual Treatment Plan  Patient Details  Name: Martin Hooper MRN: [id number] Date of Birth: 1950-12-09 Referring Provider:   Flowsheet Row CARDIAC REHAB PHASE II ORIENTATION from 09/13/2022 in [place]  Referring Provider Dr. Harl Bowie       Initial Encounter Date:  Flowsheet Row CARDIAC REHAB PHASE II ORIENTATION from 09/13/2022 in [place]  Date 09/13/22       Visit Diagnosis: NSTEMI (non-ST elevated myocardial infarction) [facility])  Patient's Home Medications on Admission:  Current Outpatient Medications:    amLODipine (NORVASC) 5 MG tablet, Take 1 tablet (5 mg total) by mouth daily., Disp: 90 tablet, Rfl: 3   apixaban (ELIQUIS) 5 MG TABS tablet, Take 1 tablet (5 mg total) by mouth 2 (two) times daily., Disp: 180 tablet, Rfl: 2   Ascorbic Acid (VITAMIN C) 1000 MG tablet, Take 1,000 mg by mouth daily., Disp: , Rfl:    atorvastatin (LIPITOR) 80 MG tablet, Take 80 mg by mouth daily., Disp: , Rfl:    benazepril (LOTENSIN) 40 MG tablet, Take 40 mg by mouth daily., Disp: , Rfl:    Cholecalciferol 125 MCG (5000 UT) TABS, Take 5,000 Units by mouth daily., Disp: , Rfl:    clopidogrel (PLAVIX) 75 MG tablet, Take 1 tablet (75 mg total) by mouth daily., Disp: 30 tablet, Rfl: 1   Coenzyme Q10 100 MG TABS, Take 100 mg by mouth daily., Disp: , Rfl:    diphenhydrAMINE (BENADRYL) 25 MG tablet, Take 25 mg by mouth at bedtime., Disp: , Rfl:    ferrous sulfate 325 (65 FE) MG tablet, Take 325 mg by mouth daily with breakfast., Disp: , Rfl:    hydrochlorothiazide (HYDRODIURIL) 25 MG tablet, Take 25 mg by mouth daily., Disp: , Rfl:    isosorbide mononitrate (IMDUR) 60 MG 24 hr tablet, Take 1 tablet (60 mg total) by mouth daily., Disp: 90 tablet, Rfl: 3   metFORMIN (GLUCOPHAGE-XR) 500 MG 24 hr tablet, Take 500-1,000 mg by mouth See admin instructions. Take '1000mg'$  in the AM and '500mg'$  in the PM., Disp: , Rfl:    metoprolol succinate (TOPROL-XL) 100  MG 24 hr tablet, Take 100 mg by mouth daily., Disp: , Rfl:    pantoprazole (PROTONIX) 40 MG tablet, Take 1 tablet (40 mg total) by mouth daily., Disp: 30 tablet, Rfl: 1   tamsulosin (FLOMAX) 0.4 MG CAPS capsule, Take 0.4 mg by mouth in the morning and at bedtime., Disp: , Rfl:    tiZANidine (ZANAFLEX) 4 MG tablet, Take 2 mg by mouth every 6 (six) hours as needed for muscle spasms., Disp: , Rfl:    TRESIBA FLEXTOUCH 200 UNIT/ML FlexTouch Pen, Inject 68 Units into the skin at bedtime., Disp: , Rfl:    Zinc 30 MG CAPS, Take 30 mg by mouth daily., Disp: , Rfl:    nitroGLYCERIN (NITROSTAT) 0.4 MG SL tablet, Place 1 tablet (0.4 mg total) under the tongue every 5 (five) minutes as needed for chest pain., Disp: 90 tablet, Rfl: 3  Past Medical History: Past Medical History:  Diagnosis Date   Allergy to alpha-gal    CAD (coronary artery disease)    Chronic kidney disease, stage 3a (HCC)    Diabetes mellitus (HCC)    High cholesterol    Hypertension    Ischemic cardiomyopathy    LV (left ventricular) mural thrombus    MI, 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    Tobacco Use: Social History   Tobacco Use  Smoking Status Never  Smokeless Tobacco Never    Labs: Review Flowsheet       Latest Ref Rng & Units 08/20/2022 08/22/2022  Labs for ITP Cardiac and Pulmonary Rehab  Cholestrol 0 - 200 mg/dL - 92   LDL (calc) 0 - 99 mg/dL - 48   HDL-C >40 mg/dL - 21   Trlycerides <150 mg/dL - 117   Hemoglobin A1c 4.8 - 5.6 % 8.8  -    Capillary Blood Glucose: Lab Results  Component Value Date   GLUCAP 254 (H) 09/13/2022   GLUCAP 243 (H) 08/22/2022   GLUCAP 233 (H) 08/21/2022   GLUCAP 294 (H) 08/21/2022   GLUCAP 374 (H) 08/21/2022     Exercise Target Goals: Exercise Program Goal: Individual exercise prescription set using results from initial 6 min walk test and THRR while considering  patient's activity barriers and safety.   Exercise Prescription Goal: Starting with aerobic activity 30 plus minutes a day, 3  days per week for initial exercise prescription. Provide home exercise prescription and guidelines that participant acknowledges understanding prior to discharge.  Activity Barriers & Risk Stratification:  Activity Barriers & Cardiac Risk Stratification - 09/13/22 0818       Activity Barriers & Cardiac Risk Stratification   Activity Barriers None    Cardiac Risk Stratification High             6 Minute Walk:  6 Minute Walk     Row Name 09/13/22 0926         6 Minute Walk   Phase Initial     Distance 1300 feet     Walk Time 6 minutes     # of Rest Breaks 0     MPH 2.46     METS 2.41     RPE 12     VO2 Peak 8.43     Symptoms No     Resting HR 62 bpm     Resting BP 110/60     Resting Oxygen Saturation  96 %     Exercise Oxygen Saturation  during 6 min walk 97 %     Max Ex. HR 70 bpm     Max Ex. BP 138/60     2 Minute Post BP 126/60              Oxygen Initial Assessment:   Oxygen Re-Evaluation:   Oxygen Discharge (Final Oxygen Re-Evaluation):   Initial Exercise Prescription:  Initial Exercise Prescription - 09/13/22 0900       Date of Initial Exercise RX and Referring Provider   Date 09/13/22    Referring Provider Dr. Harl Bowie    Expected Discharge Date 11/16/22      Treadmill   MPH 1.3    Grade 0    Minutes 17      NuStep   Level 1    SPM 60    Minutes 22      Prescription Details   Frequency (times per week) 2    Duration Progress to 30 minutes of continuous aerobic without signs/symptoms of physical distress      Intensity   THRR 40-80% of Max Heartrate 60-119    Ratings of Perceived Exertion 11-13      Resistance Training   Training Prescription Yes    Weight 4    Reps 10-15             Perform Capillary Blood Glucose checks as needed.  Exercise Prescription Changes:   Exercise Comments:   Exercise  Goals and Review:   Exercise Goals     Row Name 09/13/22 0930             Exercise Goals   Increase Physical  Activity Yes       Intervention Provide advice, education, support and counseling about physical activity/exercise needs.;Develop an individualized exercise prescription for aerobic and resistive training based on initial evaluation findings, risk stratification, comorbidities and participant's personal goals.       Expected Outcomes Short Term: Attend rehab on a regular basis to increase amount of physical activity.;Long Term: Add in home exercise to make exercise part of routine and to increase amount of physical activity.;Long Term: Exercising regularly at least 3-5 days a week.       Increase Strength and Stamina Yes       Intervention Provide advice, education, support and counseling about physical activity/exercise needs.;Develop an individualized exercise prescription for aerobic and resistive training based on initial evaluation findings, risk stratification, comorbidities and participant's personal goals.       Expected Outcomes Short Term: Increase workloads from initial exercise prescription for resistance, speed, and METs.;Short Term: Perform resistance training exercises routinely during rehab and add in resistance training at home;Long Term: Improve cardiorespiratory fitness, muscular endurance and strength as measured by increased METs and functional capacity (6MWT)       Able to understand and use rate of perceived exertion (RPE) scale Yes       Intervention Provide education and explanation on how to use RPE scale       Expected Outcomes Short Term: Able to use RPE daily in rehab to express subjective intensity level;Long Term:  Able to use RPE to guide intensity level when exercising independently       Knowledge and understanding of Target Heart Rate Range (THRR) Yes       Intervention Provide education and explanation of THRR including how the numbers were predicted and where they are located for reference       Expected Outcomes Short Term: Able to state/look up THRR;Long Term: Able  to use THRR to govern intensity when exercising independently;Short Term: Able to use daily as guideline for intensity in rehab       Able to check pulse independently Yes       Intervention Provide education and demonstration on how to check pulse in carotid and radial arteries.;Review the importance of being able to check your own pulse for safety during independent exercise       Expected Outcomes Short Term: Able to explain why pulse checking is important during independent exercise;Long Term: Able to check pulse independently and accurately       Understanding of Exercise Prescription Yes       Intervention Provide education, explanation, and written materials on patient's individual exercise prescription       Expected Outcomes Short Term: Able to explain program exercise prescription;Long Term: Able to explain home exercise prescription to exercise independently                Exercise Goals Re-Evaluation :    Discharge Exercise Prescription (Final Exercise Prescription Changes):   Nutrition:  Target Goals: Understanding of nutrition guidelines, daily intake of sodium '1500mg'$ , cholesterol '200mg'$ , calories 30% from fat and 7% or less from saturated fats, daily to have 5 or more servings of fruits and vegetables.  Biometrics:  Pre Biometrics - 09/13/22 0931       Pre Biometrics   Height '5\' 8"'$  (1.727 m)  Weight 91.9 kg    Waist Circumference 42.5 inches    Hip Circumference 39 inches    Waist to Hip Ratio 1.09 %    BMI (Calculated) 30.81    Triceps Skinfold 15 mm    % Body Fat 30 %    Grip Strength 37.5 kg    Flexibility 5 in    Single Leg Stand 15.01 seconds              Nutrition Therapy Plan and Nutrition Goals:  Nutrition Therapy & Goals - 09/13/22 0937       Personal Nutrition Goals   Comments Patient scored 22 on his diet assessment. He does try to eat healthy and has alpha gal so he is not able to eat pork or beef. Information provided and explained  on healthier choices and DM. We provided 2 educational sessions on heart healthy nutriton and RD referral if patient is interested.      Intervention Plan   Intervention Nutrition handout(s) given to patient.    Expected Outcomes Short Term Goal: Understand basic principles of dietary content, such as calories, fat, sodium, cholesterol and nutrients.             Nutrition Assessments:  Nutrition Assessments - 09/13/22 0936       MEDFICTS Scores   Pre Score 22            MEDIFICTS Score Key: ?70 Need to make dietary changes  40-70 Heart Healthy Diet ? 40 Therapeutic Level Cholesterol Diet   Picture Your Plate Scores: D34-534 Unhealthy dietary pattern with much room for improvement. 41-50 Dietary pattern unlikely to meet recommendations for good health and room for improvement. 51-60 More healthful dietary pattern, with some room for improvement.  >60 Healthy dietary pattern, although there may be some specific behaviors that could be improved.    Nutrition Goals Re-Evaluation:   Nutrition Goals Discharge (Final Nutrition Goals Re-Evaluation):   Psychosocial: Target Goals: Acknowledge presence or absence of significant depression and/or stress, maximize coping skills, provide positive support system. Participant is able to verbalize types and ability to use techniques and skills needed for reducing stress and depression.  Initial Review & Psychosocial Screening:  Initial Psych Review & Screening - 09/13/22 1003       Initial Review   Current issues with None Identified      Family Dynamics   Good Support System? Yes      Barriers   Psychosocial barriers to participate in program There are no identifiable barriers or psychosocial needs.      Screening Interventions   Interventions Encouraged to exercise;Provide feedback about the scores to participant    Expected Outcomes Short Term goal: Utilizing psychosocial counselor, staff and physician to assist with  identification of specific Stressors or current issues interfering with healing process. Setting desired goal for each stressor or current issue identified.;Short Term goal: Identification and review with participant of any Quality of Life or Depression concerns found by scoring the questionnaire.             Quality of Life Scores:  Quality of Life - 09/13/22 0931       Quality of Life   Select Quality of Life      Quality of Life Scores   Health/Function Pre 28 %    Socioeconomic Pre 30 %    Psych/Spiritual Pre 30 %    Family Pre 28.8 %    GLOBAL Pre 28.94 %  Scores of 19 and below usually indicate a poorer quality of life in these areas.  A difference of  2-3 points is a clinically meaningful difference.  A difference of 2-3 points in the total score of the Quality of Life Index has been associated with significant improvement in overall quality of life, self-image, physical symptoms, and general health in studies assessing change in quality of life.  PHQ-9: Review Flowsheet       09/13/2022  Depression screen PHQ 2/9  Decreased Interest 0  Down, Depressed, Hopeless 0  PHQ - 2 Score 0  Altered sleeping 0  Tired, decreased energy 0  Change in appetite 0  Feeling bad or failure about yourself  0  Trouble concentrating 0  Moving slowly or fidgety/restless 0  Suicidal thoughts 0  PHQ-9 Score 0  Difficult doing work/chores Not difficult at all   Interpretation of Total Score  Total Score Depression Severity:  1-4 = Minimal depression, 5-9 = Mild depression, 10-14 = Moderate depression, 15-19 = Moderately severe depression, 20-27 = Severe depression   Psychosocial Evaluation and Intervention:  Psychosocial Evaluation - 09/13/22 1004       Psychosocial Evaluation & Interventions   Interventions Encouraged to exercise with the program and follow exercise prescription;Stress management education;Relaxation education    Comments Patient has no psychosocial  barriers or issues identified at his orientation visit. His PHQ-9 score was 0. He denies any depression or anxiety. 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 He has already retruned to work but thinks he will be able to do the program around his work. He is only doing 2 days/week due to his co-payment and work schedule. He has a good support system with his wife and friends. 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 He is very familiar with the program and wants to participate again mainly to learn how his heart and blood pressure will respond to exercise since he has been mostly sedentary since his MI. He is ready to get started.    Expected Outcomes Patient will continue to have no psychosocial barriers identified.             Psychosocial Re-Evaluation:   Psychosocial Discharge (Final Psychosocial Re-Evaluation):   Vocational Rehabilitation: Provide vocational rehab assistance to qualifying candidates.   Vocational Rehab Evaluation & Intervention:  Vocational Rehab - 09/13/22 [id number]       Initial Vocational Rehab Evaluation & Intervention   Assessment shows need for Vocational Rehabilitation No      Vocational Rehab Re-Evaulation   Comments Patient has returned to work and does not need vocational rehab.             Education: Education Goals: Education classes will be provided on a weekly basis, covering required topics. Participant will state understanding/return demonstration of topics presented.  Learning Barriers/Preferences:  Learning Barriers/Preferences - 09/13/22 0959       Learning Barriers/Preferences   Learning Barriers None    Learning Preferences Skilled Demonstration             Education Topics: Hypertension, Hypertension Reduction -Define heart disease and high blood pressure. Discus how high blood pressure affects the body and ways to  reduce high blood pressure.   Exercise and Your Heart -Discuss why it is important to exercise, the FITT principles of exercise, normal and abnormal responses to exercise, and how to exercise safely.  Angina -Discuss definition of angina, causes of angina, treatment of angina, and how to decrease risk of having angina.   Cardiac Medications -Review what the following cardiac medications are used for, how they affect the body, and side effects that may occur when taking the medications.  Medications include Aspirin, Beta blockers, calcium channel blockers, ACE Inhibitors, angiotensin receptor blockers, diuretics, digoxin, and antihyperlipidemics.   Congestive Heart Failure -Discuss the definition of CHF, how to live with CHF, the signs and symptoms of CHF, and how keep track of weight and sodium intake.   Heart Disease and Intimacy -Discus the effect sexual activity has on the heart, how changes occur during intimacy as we age, and safety during sexual activity.   Smoking Cessation / COPD -Discuss different methods to quit smoking, the health benefits of quitting smoking, and the definition of COPD.   Nutrition I: Fats -Discuss the types of cholesterol, what cholesterol does to the heart, and how cholesterol levels can be controlled.   Nutrition II: Labels -Discuss the different components of food labels and how to read food label   Heart Parts/Heart Disease and PAD -Discuss the anatomy of the heart, the pathway of blood circulation through the heart, and these are affected by heart disease.   Stress I: Signs and Symptoms -Discuss the causes of stress, how stress may lead to anxiety and depression, and ways to limit stress.   Stress II: Relaxation -Discuss different types of relaxation techniques to limit stress.   Warning Signs of Stroke / TIA -Discuss definition of a stroke, what the signs and symptoms are of a stroke, and how to identify when someone is having  stroke.   Knowledge Questionnaire Score:  Knowledge Questionnaire Score - 09/13/22 0958       Knowledge Questionnaire Score   Pre Score 18/24             Core Components/Risk Factors/Patient Goals at Admission:  Personal Goals and Risk Factors at Admission - 09/13/22 0959       Core Components/Risk Factors/Patient Goals on Admission    Weight Management Yes    Intervention Weight Management: Provide education and appropriate resources to help participant work on and attain dietary goals.;Weight Management/Obesity: Establish reasonable short term and long term weight goals.;Obesity: Provide education and appropriate resources to help participant work on and attain dietary goals.    Admit Weight 202 lb 14.4 oz (92 kg)    Goal Weight: Short Term 197 lb (89.4 kg)    Goal Weight: Long Term 192 lb (87.1 kg)    Expected Outcomes Long Term: Adherence to nutrition and physical activity/exercise program aimed toward attainment of established weight goal;Weight Loss: Understanding of general recommendations for a balanced deficit meal plan, which promotes 1-2 lb weight loss per week and includes a negative energy balance of [phone] kcal/d;Understanding recommendations for meals to include 15-35% energy as protein, 25-35% energy from fat, 35-60% energy from carbohydrates, less than '200mg'$  of dietary cholesterol, 20-35 gm of total fiber daily;Weight Gain: Understanding of general recommendations for a high calorie, high protein meal plan that promotes weight gain by distributing calorie intake throughout the day with the consumption for 4-5 meals, snacks, and/or supplements    Diabetes Yes    Intervention Provide education about signs/symptoms and action to take for hypo/hyperglycemia.;Provide education about proper nutrition, including hydration, and aerobic/resistive exercise prescription along with prescribed medications to achieve blood glucose in normal ranges: Fasting glucose 65-99 mg/dL     Expected Outcomes Short Term: Participant  verbalizes understanding of the signs/symptoms and immediate care of hyper/hypoglycemia, proper foot care and importance of medication, aerobic/resistive exercise and nutrition plan for blood glucose control.;Long Term: Attainment of HbA1C < 7%.    Hypertension Yes    Intervention Provide education on lifestyle modifcations including regular physical activity/exercise, weight management, moderate sodium restriction and increased consumption of fresh fruit, vegetables, and low fat dairy, alcohol moderation, and smoking cessation.;Monitor prescription use compliance.    Expected Outcomes Short Term: Continued assessment and intervention until BP is < 140/10m HG in hypertensive participants. < 130/842mHG in hypertensive participants with diabetes, heart failure or chronic kidney disease.;Long Term: Maintenance of blood pressure at goal levels.    Lipids Yes    Intervention Provide education and support for participant on nutrition & aerobic/resistive exercise along with prescribed medications to achieve LDL '70mg'$ , HDL >'40mg'$ .    Expected Outcomes Short Term: Participant states understanding of desired cholesterol values and is compliant with medications prescribed. Participant is following exercise prescription and nutrition guidelines.;Long Term: Cholesterol controlled with medications as prescribed, with individualized exercise RX and with personalized nutrition plan. Value goals: LDL < '70mg'$ , HDL > 40 mg.    Personal Goal Other Yes    Personal Goal Patient wants to lose weight; learn how his heart and blood pressure will respond to exercise since he has been sedentary since his MI.    Intervention Patient will attend CR 3 days/week with exercise and education.    Expected Outcomes Patient will complete the program meeting both personal and program goals.             Core Components/Risk Factors/Patient Goals Review:    Core Components/Risk  Factors/Patient Goals at Discharge (Final Review):    ITP Comments:   Comments: Patient arrived for 1st visit/orientation/education at 0800. Patient was referred to CR by Dr. JoCarlyle Dollyue to NSTEMI (I21.4). During orientation advised patient on arrival and appointment times what to wear, what to do before, during and after exercise. Reviewed attendance and class policy.  Pt is scheduled to return Cardiac Rehab on 09/19/22 at 815. Pt was advised to come to class 15 minutes before class starts.  Discussed RPE/Dpysnea scales. Patient participated in warm up stretches. Patient was able to complete 6 minute walk test.  Telemetry:NSR. Patient was measured for the equipment. Discussed equipment safety with patient. Took patient pre-anthropometric measurements. Patient finished visit at 930.

## 2022-09-13 NOTE — Telephone Encounter (Signed)
FMLA forms completed by Dr. Harl Bowie in the [place] office-   Pt was contacted by Reola Calkins to let him know that they were completed and ready if he wanted to pick them up.   Forms were faxed to the fax number given and confirmation received per Estill Bamberg   Will fax forms to Lucy Chris for charge posting

## 2022-09-14 ENCOUNTER — Ambulatory Visit: Payer: BC Managed Care – PPO | Admitting: Internal Medicine

## 2022-09-18 NOTE — Progress Notes (Signed)
Discharge Progress Report  Patient Details  Name: Martin Hooper MRN: [id number] Date of Birth: 03/02/1951 Referring Provider:   Flowsheet Row CARDIAC REHAB PHASE II ORIENTATION from 09/13/2022 in [place]  Referring Provider Dr. Harl Bowie        Number of Visits: 1  Reason for Discharge:  Early Exit:  Insurance  Smoking History:  Social History   Tobacco Use  Smoking Status Never  Smokeless Tobacco Never    Diagnosis:  NSTEMI (non-ST elevated myocardial infarction) ([place])  ADL UCSD:   Initial Exercise Prescription:  Initial Exercise Prescription - 09/13/22 0900       Date of Initial Exercise RX and Referring Provider   Date 09/13/22    Referring Provider Dr. Harl Bowie    Expected Discharge Date 11/16/22      Treadmill   MPH 1.3    Grade 0    Minutes 17      NuStep   Level 1    SPM 60    Minutes 22      Prescription Details   Frequency (times per week) 2    Duration Progress to 30 minutes of continuous aerobic without signs/symptoms of physical distress      Intensity   THRR 40-80% of Max Heartrate 60-119    Ratings of Perceived Exertion 11-13      Resistance Training   Training Prescription Yes    Weight 4    Reps 10-15             Discharge Exercise Prescription (Final Exercise Prescription Changes):   Functional Capacity:  6 Minute Walk     Row Name 09/13/22 0926         6 Minute Walk   Phase Initial     Distance 1300 feet     Walk Time 6 minutes     # of Rest Breaks 0     MPH 2.46     METS 2.41     RPE 12     VO2 Peak 8.43     Symptoms No     Resting HR 62 bpm     Resting BP 110/60     Resting Oxygen Saturation  96 %     Exercise Oxygen Saturation  during 6 min walk 97 %     Max Ex. HR 70 bpm     Max Ex. BP 138/60     2 Minute Post BP 126/60              Psychological, QOL, Others - Outcomes: PHQ 2/9:    09/13/2022    9:15 AM  Depression screen PHQ 2/9  Decreased Interest 0  Down,  Depressed, Hopeless 0  PHQ - 2 Score 0  Altered sleeping 0  Tired, decreased energy 0  Change in appetite 0  Feeling bad or failure about yourself  0  Trouble concentrating 0  Moving slowly or fidgety/restless 0  Suicidal thoughts 0  PHQ-9 Score 0  Difficult doing work/chores Not difficult at all    Quality of Life:  Quality of Life - 09/13/22 0931       Quality of Life   Select Quality of Life      Quality of Life Scores   Health/Function Pre 28 %    Socioeconomic Pre 30 %    Psych/Spiritual Pre 30 %    Family Pre 28.8 %    GLOBAL Pre 28.94 %  Personal Goals: Goals established at orientation with interventions provided to work toward goal.  Personal Goals and Risk Factors at Admission - 09/13/22 0959       Core Components/Risk Factors/Patient Goals on Admission    Weight Management Yes    Intervention Weight Management: Provide education and appropriate resources to help participant work on and attain dietary goals.;Weight Management/Obesity: Establish reasonable short term and long term weight goals.;Obesity: Provide education and appropriate resources to help participant work on and attain dietary goals.    Admit Weight 202 lb 14.4 oz (92 kg)    Goal Weight: Short Term 197 lb (89.4 kg)    Goal Weight: Long Term 192 lb (87.1 kg)    Expected Outcomes Long Term: Adherence to nutrition and physical activity/exercise program aimed toward attainment of established weight goal;Weight Loss: Understanding of general recommendations for a balanced deficit meal plan, which promotes 1-2 lb weight loss per week and includes a negative energy balance of [phone] kcal/d;Understanding recommendations for meals to include 15-35% energy as protein, 25-35% energy from fat, 35-60% energy from carbohydrates, less than '200mg'$  of dietary cholesterol, 20-35 gm of total fiber daily;Weight Gain: Understanding of general recommendations for a high calorie, high protein meal plan that  promotes weight gain by distributing calorie intake throughout the day with the consumption for 4-5 meals, snacks, and/or supplements    Diabetes Yes    Intervention Provide education about signs/symptoms and action to take for hypo/hyperglycemia.;Provide education about proper nutrition, including hydration, and aerobic/resistive exercise prescription along with prescribed medications to achieve blood glucose in normal ranges: Fasting glucose 65-99 mg/dL    Expected Outcomes Short Term: Participant verbalizes understanding of the signs/symptoms and immediate care of hyper/hypoglycemia, proper foot care and importance of medication, aerobic/resistive exercise and nutrition plan for blood glucose control.;Long Term: Attainment of HbA1C < 7%.    Hypertension Yes    Intervention Provide education on lifestyle modifcations including regular physical activity/exercise, weight management, moderate sodium restriction and increased consumption of fresh fruit, vegetables, and low fat dairy, alcohol moderation, and smoking cessation.;Monitor prescription use compliance.    Expected Outcomes Short Term: Continued assessment and intervention until BP is < 140/55m HG in hypertensive participants. < 130/835mHG in hypertensive participants with diabetes, heart failure or chronic kidney disease.;Long Term: Maintenance of blood pressure at goal levels.    Lipids Yes    Intervention Provide education and support for participant on nutrition & aerobic/resistive exercise along with prescribed medications to achieve LDL '70mg'$ , HDL >'40mg'$ .    Expected Outcomes Short Term: Participant states understanding of desired cholesterol values and is compliant with medications prescribed. Participant is following exercise prescription and nutrition guidelines.;Long Term: Cholesterol controlled with medications as prescribed, with individualized exercise RX and with personalized nutrition plan. Value goals: LDL < '70mg'$ , HDL > 40 mg.     Personal Goal Other Yes    Personal Goal Patient wants to lose weight; learn how his heart and blood pressure will respond to exercise since he has been sedentary since his MI.    Intervention Patient will attend CR 3 days/week with exercise and education.    Expected Outcomes Patient will complete the program meeting both personal and program goals.              Personal Goals Discharge:   Exercise Goals and Review:  Exercise Goals     Row Name 09/13/22 0930             Exercise Goals   Increase Physical  Activity Yes       Intervention Provide advice, education, support and counseling about physical activity/exercise needs.;Develop an individualized exercise prescription for aerobic and resistive training based on initial evaluation findings, risk stratification, comorbidities and participant's personal goals.       Expected Outcomes Short Term: Attend rehab on a regular basis to increase amount of physical activity.;Long Term: Add in home exercise to make exercise part of routine and to increase amount of physical activity.;Long Term: Exercising regularly at least 3-5 days a week.       Increase Strength and Stamina Yes       Intervention Provide advice, education, support and counseling about physical activity/exercise needs.;Develop an individualized exercise prescription for aerobic and resistive training based on initial evaluation findings, risk stratification, comorbidities and participant's personal goals.       Expected Outcomes Short Term: Increase workloads from initial exercise prescription for resistance, speed, and METs.;Short Term: Perform resistance training exercises routinely during rehab and add in resistance training at home;Long Term: Improve cardiorespiratory fitness, muscular endurance and strength as measured by increased METs and functional capacity (6MWT)       Able to understand and use rate of perceived exertion (RPE) scale Yes       Intervention Provide  education and explanation on how to use RPE scale       Expected Outcomes Short Term: Able to use RPE daily in rehab to express subjective intensity level;Long Term:  Able to use RPE to guide intensity level when exercising independently       Knowledge and understanding of Target Heart Rate Range (THRR) Yes       Intervention Provide education and explanation of THRR including how the numbers were predicted and where they are located for reference       Expected Outcomes Short Term: Able to state/look up THRR;Long Term: Able to use THRR to govern intensity when exercising independently;Short Term: Able to use daily as guideline for intensity in rehab       Able to check pulse independently Yes       Intervention Provide education and demonstration on how to check pulse in carotid and radial arteries.;Review the importance of being able to check your own pulse for safety during independent exercise       Expected Outcomes Short Term: Able to explain why pulse checking is important during independent exercise;Long Term: Able to check pulse independently and accurately       Understanding of Exercise Prescription Yes       Intervention Provide education, explanation, and written materials on patient's individual exercise prescription       Expected Outcomes Short Term: Able to explain program exercise prescription;Long Term: Able to explain home exercise prescription to exercise independently                Exercise Goals Re-Evaluation:   Nutrition & Weight - Outcomes:  Pre Biometrics - 09/13/22 0931       Pre Biometrics   Height '5\' 8"'$  (1.727 m)    Weight 202 lb 9.6 oz (91.9 kg)    Waist Circumference 42.5 inches    Hip Circumference 39 inches    Waist to Hip Ratio 1.09 %    BMI (Calculated) 30.81    Triceps Skinfold 15 mm    % Body Fat 30 %    Grip Strength 37.5 kg    Flexibility 5 in    Single Leg Stand 15.01 seconds  Nutrition:  Nutrition Therapy & Goals -  09/13/22 0937       Personal Nutrition Goals   Comments Patient scored 22 on his diet assessment. He does try to eat healthy and has alpha gal so he is not able to eat pork or beef. Information provided and explained on healthier choices and DM. We provided 2 educational sessions on heart healthy nutriton and RD referral if patient is interested.      Intervention Plan   Intervention Nutrition handout(s) given to patient.    Expected Outcomes Short Term Goal: Understand basic principles of dietary content, such as calories, fat, sodium, cholesterol and nutrients.             Nutrition Discharge:  Nutrition Assessments - 09/13/22 0936       MEDFICTS Scores   Pre Score 22             Education Questionnaire Score:  Knowledge Questionnaire Score - 09/13/22 0958       Knowledge Questionnaire Score   Pre Score 18/24             Pt discharged from CR after 1 visit. He called on 3/4 to state that he no longer wanted to do the program due to his $25 dollar copay. He states, someone told him when he was in the hospital that cardiac rehab was "free". He was informed multiple times by staff regarding his copay before he joined the program.

## 2022-09-19 ENCOUNTER — Encounter (HOSPITAL_COMMUNITY): Payer: BC Managed Care – PPO

## 2022-09-21 ENCOUNTER — Encounter (HOSPITAL_COMMUNITY): Payer: BC Managed Care – PPO

## 2022-09-24 ENCOUNTER — Ambulatory Visit: Payer: BC Managed Care – PPO | Admitting: Internal Medicine

## 2022-09-26 ENCOUNTER — Encounter (HOSPITAL_COMMUNITY): Payer: BC Managed Care – PPO

## 2022-09-28 ENCOUNTER — Ambulatory Visit (INDEPENDENT_AMBULATORY_CARE_PROVIDER_SITE_OTHER): Payer: BC Managed Care – PPO | Admitting: Allergy & Immunology

## 2022-09-28 ENCOUNTER — Other Ambulatory Visit: Payer: Self-pay

## 2022-09-28 ENCOUNTER — Encounter (HOSPITAL_COMMUNITY): Payer: BC Managed Care – PPO

## 2022-09-28 ENCOUNTER — Encounter: Payer: Self-pay | Admitting: Allergy & Immunology

## 2022-09-28 VITALS — BP 118/70 | HR 80 | Temp 98.3°F | Resp 20 | Ht 68.0 in | Wt 204.0 lb

## 2022-09-28 DIAGNOSIS — L500 Allergic urticaria: Secondary | ICD-10-CM | POA: Diagnosis not present

## 2022-09-28 DIAGNOSIS — T7800XD Anaphylactic reaction due to unspecified food, subsequent encounter: Secondary | ICD-10-CM

## 2022-09-28 DIAGNOSIS — T7800XA Anaphylactic reaction due to unspecified food, initial encounter: Secondary | ICD-10-CM | POA: Diagnosis not present

## 2022-09-28 MED ORDER — EPINEPHRINE 0.3 MG/0.3ML IJ SOAJ: 0.3000 mg | Freq: Once | INTRAMUSCULAR | 2 refills | Status: AC

## 2022-09-28 NOTE — Progress Notes (Unsigned)
NEW PATIENT  Date of Service/Encounter:  09/28/22  Consult requested by: Caryl Bis, MD   Assessment:   No diagnosis found.  Plan/Recommendations:    There are no Patient Instructions on file for this visit.   {Blank single:19197::"This note in its entirety was forwarded to the Provider who requested this consultation."}  Subjective:   Martin Hooper is a 72 y.o. male presenting today for evaluation of  Chief Complaint  Patient presents with   Other    Pt has alpha gal issues gets hives when eating beef    Martin Hooper has a history of the following: Patient Active Problem List   Diagnosis Date Noted   Stage 3a chronic kidney disease (CKD) ([place]) 08/21/2022   NSTEMI (non-ST elevated myocardial infarction) ([place]) 08/20/2022   Diabetes ([place]) 08/20/2022   Coronary artery disease of native artery of native heart with stable angina pectoris ([place]) 08/16/2022   LV (left ventricular) mural thrombus 08/15/2022   HTN, goal below 130/80 07/12/2022   HLD (hyperlipidemia) 07/12/2022   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  Martin Hooper was referred by Caryl Bis, MD.     Martin Hooper is a 72 y.o. 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 He was diagnosed with alpha gal 9 years ago. He was diagnosed by [facility]. He moved to [place] around 5 years ago. He has not been retested since then.   He has had accidental reactions but it has been at least 6 years. He had several ED trips to the ED including hypotensions, wide spread hives. He was living a dangerous.   He misses pork - quite a bit. He does not miss the beef at all at this point. He can tolerates yogurt and other dairy without a problem.   He is taking some extra enzymes for flatulence. He noticed that it had alpha galactosidase.   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    {Blank single:19197::"Asthma/Respiratory Symptom History: ***"," "}  {Blank single:19197::"Allergic Rhinitis Symptom History: ***"," "}  {Blank single:19197::"Food Allergy Symptom History: ***"," "}  {Blank single:19197::"Skin Symptom History: ***"," "}  {Blank single:19197::"GERD Symptom History: ***"," "}  ***Otherwise, there is no history of other atopic diseases, including {Blank multiple:19196:o:"asthma","food allergies","drug allergies","environmental allergies","stinging insect allergies","eczema","urticaria","contact dermatitis"}. There is no significant infectious history. ***Vaccinations are up to date.    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 If you have any questions, ask your nurse or doctor.  amLODipine 5 MG tablet Commonly known as: NORVASC Take 1 tablet (5 mg total) by mouth daily.   apixaban 5 MG Tabs tablet Commonly known as: Eliquis Take 1 tablet (5 mg total) by mouth 2 (two) times daily.   atorvastatin 80 MG tablet Commonly known as: LIPITOR Take 80 mg by mouth daily.   benazepril 40 MG tablet Commonly known as: LOTENSIN Take  40 mg by mouth daily.   Cholecalciferol 125 MCG (5000 UT) Tabs Take 5,000 Units by mouth daily.   clopidogrel 75 MG tablet Commonly known as: PLAVIX Take 1 tablet (75 mg total) by mouth daily.   Coenzyme Q10 100 MG Tabs Take 100 mg by mouth daily.   diphenhydrAMINE 25 MG tablet Commonly known as: BENADRYL Take 25 mg by mouth at bedtime.   ferrous sulfate 325 (65 FE) MG tablet Take 325 mg by mouth daily with breakfast.   hydrochlorothiazide 25 MG tablet Commonly known as: HYDRODIURIL Take 25 mg by mouth daily.   isosorbide mononitrate 60 MG 24 hr tablet Commonly known as: IMDUR Take 1 tablet (60 mg total) by mouth daily.   metFORMIN 500 MG 24 hr tablet Commonly known as: GLUCOPHAGE-XR Take 500-1,000 mg by mouth See admin instructions. Take 1000mg  in the AM and 500mg  in the PM.   metoprolol succinate 100 MG 24 hr tablet Commonly known as: TOPROL-XL Take 100 mg by mouth daily.   nitroGLYCERIN 0.4 MG SL tablet Commonly known as: NITROSTAT Place 1 tablet (0.4 mg total) under the tongue every 5 (five) minutes as needed for chest pain.   pantoprazole 40 MG tablet Commonly known as: PROTONIX Take 1 tablet (40 mg total) by mouth daily.   tamsulosin 0.4 MG Caps capsule Commonly known as: FLOMAX Take 0.4 mg by mouth in the morning and at bedtime.   tiZANidine 4 MG tablet Commonly known as: ZANAFLEX Take 2 mg by mouth every 6 (six) hours as needed for muscle spasms.   [name] FlexTouch 200 UNIT/ML FlexTouch Pen Generic drug: insulin degludec Inject 68 Units into the skin at bedtime.   vitamin C 1000 MG tablet Take 1,000 mg by mouth daily.   Zinc 30 MG Caps Take 30 mg by mouth daily.        Birth History: {Blank single:19197::"non-contributory","born premature and spent time in the NICU","born at term without complications"}  Developmental History: Aitor has met all milestones on time. He has required no {Blank multiple:19196:a:"speech therapy","occupational  therapy","physical therapy"}. ***non-contributory  Past Surgical History: Past Surgical History:  Procedure Laterality Date   CORONARY ARTERY BYPASS GRAFT     x 8 years ago    CORONARY/GRAFT ANGIOGRAPHY N/A 08/16/2022   Procedure: CORONARY/GRAFT ANGIOGRAPHY;  Surgeon: Burnell Blanks, MD;  Location: [facility];  Service: Cardiovascular;  Laterality: N/A;   HERNIA REPAIR       Family History: Family History  Problem Relation Age of Onset   Diabetes Mother    Schizophrenia Mother    Cancer Father      Social History: Iley lives at home with ***. He lives in a mobile home and is 72 years old.  There is carpeting throughout the home.  They have a trip heating and heat pump for cooling.  There are dogs and cats outside of the home.  There are dust mite covers on the bed, but not the pillows.  There is no tobacco exposure.  He currently works as a Education administrator for 21 years.  There is no  fume, chemical, or dust exposure.  There is no HEPA filter.  They do not live near an interstate or industrial area.  He was a former smoker but quit in 1999.   ROS     Objective:   Blood pressure 118/70, pulse 80, temperature 98.3 F (36.8 C), resp. rate 20, height 5\' 8"  (1.727 m), weight 204 lb (92.5 kg), SpO2 96 %. Body mass index is 31.02 kg/m.     Physical Exam   Diagnostic studies: {Blank single:19197::"none","deferred due to recent antihistamine use","labs sent instead"," "}  Spirometry: {Blank single:19197::"results normal (FEV1: ***%, FVC: ***%, FEV1/FVC: ***%)","results abnormal (FEV1: ***%, FVC: ***%, FEV1/FVC: ***%)"}.    {Blank single:19197::"Spirometry consistent with mild obstructive disease","Spirometry consistent with moderate obstructive disease","Spirometry consistent with severe obstructive disease","Spirometry consistent with possible restrictive disease","Spirometry consistent with mixed obstructive and restrictive  disease","Spirometry uninterpretable due to technique","Spirometry consistent with normal pattern"}. {Blank single:19197::"Albuterol/Atrovent nebulizer","Xopenex/Atrovent nebulizer","Albuterol nebulizer","Albuterol four puffs via MDI","Xopenex four puffs via MDI"} treatment given in clinic with {Blank single:19197::"significant improvement in FEV1 per ATS criteria","significant improvement in FVC per ATS criteria","significant improvement in FEV1 and FVC per ATS criteria","improvement in FEV1, but not significant per ATS criteria","improvement in FVC, but not significant per ATS criteria","improvement in FEV1 and FVC, but not significant per ATS criteria","no improvement"}.  Allergy Studies: {Blank single:19197::"none","labs sent instead"," "}    {Blank single:19197::"Allergy testing results were read and interpreted by myself, documented by clinical staff."," "}         Salvatore Marvel, MD [facility] [facility]

## 2022-09-28 NOTE — Patient Instructions (Addendum)
1. Allergic urticaria - We will retest your alpha gal levels today. - EpiPen reviewed.  - We could consider adding on Xolair as a protective factor. - This injection was recently approved to "treat" food allergies (really it just increases the threshold required to have a reaction).   2. Return in about 6 months (around 03/31/2023). You can have the follow up appointment with Dr. Ernst Bowler or a Nurse Practicioner (our Nurse Practitioners are excellent and always have Physician oversight!).    Please inform us of any Emergency Department visits, hospitalizations, or changes in symptoms. Call us before going to the ED for breathing or allergy symptoms since we might be able to fit you in for a sick visit. Feel free to contact us anytime with any questions, problems, or concerns.  It was a pleasure to meet you today!  Websites that have reliable patient information: 1. American Academy of Asthma, Allergy, and Immunology: www.aaaai.org 2. Food Allergy Research and Education (FARE): foodallergy.org 3. Mothers of Asthmatics: http://www.asthmacommunitynetwork.org 4. American College of Allergy, Asthma, and Immunology: www.acaai.org   COVID-19 Vaccine Information can be found at: ShippingScam.co.uk For questions related to vaccine distribution or appointments, please email [email or web address] or call [phone].   We realize that you might be concerned about having an allergic reaction to the COVID19 vaccines. To help with that concern, WE ARE OFFERING THE COVID19 VACCINES IN OUR OFFICE! Ask the front desk for dates!     "Like" [place] on Facebook and Instagram for our latest updates!      A healthy democracy works best when [employer] participate! Make sure you are registered to vote! If you have moved or changed any of your contact information, you will need to get this updated before voting!  In some cases, you MAY be able to  register to vote online: [email or web address]

## 2022-10-03 ENCOUNTER — Encounter (HOSPITAL_COMMUNITY): Payer: BC Managed Care – PPO

## 2022-10-04 LAB — ALPHA-GAL PANEL
Allergen Lamb IgE: 1.38 kU/L — AB
Beef IgE: 1.94 kU/L — AB
IgE (Immunoglobulin E), Serum: 115 IU/mL (ref 6–495)
O215-IgE Alpha-Gal: 3.65 kU/L — AB
Pork IgE: 1.19 kU/L — AB

## 2022-10-04 LAB — TRYPTASE: Tryptase: 8.2 ug/L (ref 2.2–13.2)

## 2022-10-05 ENCOUNTER — Encounter (HOSPITAL_COMMUNITY): Payer: BC Managed Care – PPO

## 2022-10-10 ENCOUNTER — Encounter (HOSPITAL_COMMUNITY): Payer: BC Managed Care – PPO

## 2022-10-12 ENCOUNTER — Encounter (HOSPITAL_COMMUNITY): Payer: BC Managed Care – PPO

## 2022-10-17 ENCOUNTER — Encounter (HOSPITAL_COMMUNITY): Payer: BC Managed Care – PPO

## 2022-10-19 ENCOUNTER — Encounter (HOSPITAL_COMMUNITY): Payer: BC Managed Care – PPO

## 2022-10-22 ENCOUNTER — Other Ambulatory Visit: Payer: Self-pay

## 2022-10-22 MED ORDER — CLOPIDOGREL BISULFATE 75 MG PO TABS: 75.0000 mg | ORAL_TABLET | Freq: Every day | ORAL | 3 refills | Status: DC

## 2022-10-24 ENCOUNTER — Encounter (HOSPITAL_COMMUNITY): Payer: BC Managed Care – PPO

## 2022-10-26 ENCOUNTER — Encounter (HOSPITAL_COMMUNITY): Payer: BC Managed Care – PPO

## 2022-10-31 ENCOUNTER — Encounter (HOSPITAL_COMMUNITY): Payer: BC Managed Care – PPO

## 2022-11-01 ENCOUNTER — Encounter: Payer: Self-pay | Admitting: Cardiology

## 2022-11-01 ENCOUNTER — Ambulatory Visit: Payer: BC Managed Care – PPO | Admitting: Cardiology

## 2022-11-01 ENCOUNTER — Ambulatory Visit: Payer: BC Managed Care – PPO | Attending: Cardiology | Admitting: Cardiology

## 2022-11-01 VITALS — BP 138/70 | HR 55 | Ht 68.0 in | Wt 205.0 lb

## 2022-11-01 DIAGNOSIS — I25118 Atherosclerotic heart disease of native coronary artery with other forms of angina pectoris: Secondary | ICD-10-CM

## 2022-11-01 DIAGNOSIS — I10 Essential (primary) hypertension: Secondary | ICD-10-CM | POA: Diagnosis not present

## 2022-11-01 DIAGNOSIS — E782 Mixed hyperlipidemia: Secondary | ICD-10-CM | POA: Diagnosis not present

## 2022-11-01 DIAGNOSIS — I513 Intracardiac thrombosis, not elsewhere classified: Secondary | ICD-10-CM

## 2022-11-01 MED ORDER — ISOSORBIDE MONONITRATE ER 60 MG PO TB24: 90.0000 mg | ORAL_TABLET | Freq: Every day | ORAL | 3 refills | Status: DC

## 2022-11-01 NOTE — Progress Notes (Signed)
Clinical Summary [name] is a 72 y.o.male  1.CAD - - history of CAD s/p PCI in 2000, 5V CABG  LIMA to LAD, SVG to OM 2, OM 3, D1, PDA  in 2013 - admit 08/2022 with NSTEMI.  - peak trop 552  - echo limited LVEF 55-60%, apical akineis with ongoing apical thrombus  08/2022 cath: mid LAD occluded, D2 occluded, LCX mid occlusion, RCA mid occlusion. 4/5 patent bypass grafts, distal limb of sequential SVG most distal OM Martin Hooper is nearly occluded, this area had collaterals. No PCI targets.  - I suspect subtotal occluded graft Martin Hooper progressed to complete occlusion.    - NSTEMI managed medically, no PCI targets - discharged on eliquis, plavix.  - some recent chest pains, daily. Can occur at rest or with activity. Not bad enough to take NG. 3-4/10 in severity, some fatigue. Lasts few minutes. Unclear if food related.   2. LV thrombus - noted on Jan 2024 echo, also noted on repeat limited 08/2022 echo   3.Alpha gal - followed by allergist  4. 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 90 tablet 3   apixaban (ELIQUIS) 5 MG TABS tablet Take 1 tablet (5 mg total) by mouth 2 (two) times daily. 180 tablet 2   Ascorbic Acid (VITAMIN C) 1000 MG tablet Take 1,000 mg by mouth daily.     atorvastatin (LIPITOR) 80 MG tablet Take 80 mg by mouth daily.      benazepril (LOTENSIN) 40 MG tablet Take 40 mg by mouth daily.     Cholecalciferol 125 MCG (5000 UT) TABS Take 5,000 Units by mouth daily.     clopidogrel (PLAVIX) 75 MG tablet Take 1 tablet (75 mg total) by mouth daily. 30 tablet 3   Coenzyme Q10 100 MG TABS Take 100 mg by mouth daily.     diphenhydrAMINE (BENADRYL) 25 MG tablet Take 25 mg by mouth at bedtime.     ferrous sulfate 325 (65 FE) MG tablet Take 325 mg by mouth daily with breakfast.     hydrochlorothiazide (HYDRODIURIL) 25 MG tablet Take 25 mg by mouth daily.     isosorbide mononitrate (IMDUR) 60 MG 24 hr tablet Take 1 tablet (60 mg total) by mouth daily. 90 tablet 3   metFORMIN (GLUCOPHAGE-XR) 500 MG 24 hr tablet Take 500-1,000 mg by mouth See admin instructions. Take  in the AM and  in the PM.     metoprolol succinate (TOPROL-XL) 100 MG 24 hr tablet Take 100 mg by mouth daily.     nitroGLYCERIN (NITROSTAT) 0.4 MG SL tablet Place 1 tablet (0.4 mg total) under the tongue every 5 (five) minutes as needed for chest pain. 90 tablet 3  tamsulosin (FLOMAX) 0.4 MG CAPS capsule Take 0.4 mg by mouth in the morning and at bedtime.     tiZANidine (ZANAFLEX) 4 MG tablet Take 2 mg by mouth every 6 (six) hours as needed for muscle spasms.     TRESIBA FLEXTOUCH 200 UNIT/ML FlexTouch Pen Inject 68 Units into the skin at bedtime.     Zinc 30 MG CAPS Take 30 mg by mouth daily.     No current facility-administered medications for this visit.     Past Surgical History:  Procedure Laterality Date   CORONARY ARTERY BYPASS GRAFT     x 8 years ago    CORONARY/GRAFT ANGIOGRAPHY N/A 08/16/2022   Procedure: CORONARY/GRAFT ANGIOGRAPHY;  Surgeon: Kathleene Hazel, MD;  Location: [facility];  Service: Cardiovascular;  Laterality: N/A;   HERNIA REPAIR       Allergies  Allergen Reactions   Alpha-Gal Anaphylaxis    Any mammal meat    Beef (Bovine) Protein Anaphylaxis   Pork-Derived Products Anaphylaxis      Family  History  Problem Relation Age of Onset   Diabetes Mother    Schizophrenia Mother    Cancer Father      Social History Martin Hooper reports that he has never smoked. He has never used smokeless tobacco. Martin Hooper reports no history of alcohol use.   Review of Systems CONSTITUTIONAL: No weight loss, fever, chills, weakness or fatigue.  HEENT: Eyes: No visual loss, blurred vision, double vision or yellow sclerae.No hearing loss, sneezing, congestion, runny nose or sore throat.  SKIN: No rash or itching.  CARDIOVASCULAR: per hpi RESPIRATORY: No shortness of breath, cough or sputum.  GASTROINTESTINAL: No anorexia, nausea, vomiting or diarrhea. No abdominal pain or blood.  GENITOURINARY: No burning on urination, no polyuria NEUROLOGICAL: No headache, dizziness, syncope, paralysis, ataxia, numbness or tingling in the extremities. No change in bowel or bladder control.  MUSCULOSKELETAL: No muscle, back pain, joint pain or stiffness.  LYMPHATICS: No enlarged nodes. No history of splenectomy.  PSYCHIATRIC: No history of depression or anxiety.  ENDOCRINOLOGIC: No reports of sweating, cold or heat intolerance. No polyuria or polydipsia.  Marland Kitchen   Physical Examination Vitals:   11/01/22 1508 11/01/22 1554  BP: (!) 144/64 138/70  Pulse: (!) 55   SpO2: 97%    Filed Weights   11/01/22 1508  Weight: 205 lb (93 kg)    Gen: resting comfortably, no acute distress HEENT: no scleral icterus, pupils equal round and reactive, no palptable cervical adenopathy,  CV: RRR, no m/rg, no jvd Resp: Clear to auscultation bilaterally GI: abdomen is soft, non-tender, non-distended, normal bowel sounds, no hepatosplenomegaly MSK: extremities are warm, no edema.  Skin: warm, no rash Neuro:  no focal deficits Psych: appropriate affect      Assessment and Plan   1.CAD with unspecified angina - medically managed NSTEMI as reported above, no PCI target - continue plavix for 1 year, has been on eliquis  as well for LV thrombus - some recent chest pains unclear etiology, trial of imudr  daily - EKG today SR, no acute ischemic changes  2. LV thrombus - continue eliquis at least 6 months, repeat echo 01/2023. Pending results decide if longer term therapy.   3. HTN - mildly elevated here, last several visits well controlled - monitor at this time  4. Hyperlipidemia - at goal, continue current meds  F/u 6 months   Antoine Poche, M.D.

## 2022-11-01 NOTE — Patient Instructions (Signed)
Medication Instructions:  Your physician has recommended you make the following change in your medication:   -Increase Imdur to 90 mg tablets once daily.   *If you need a refill on your cardiac medications before your next appointment, please call your pharmacy*   Lab Work: None If you have labs (blood work) drawn today and your tests are completely normal, you will receive your results only by: MyChart Message (if you have MyChart) OR A paper copy in the mail If you have any lab test that is abnormal or we need to change your treatment, we will call you to review the results.   Testing/Procedures: Your physician has requested that you have an echocardiogram. Echocardiography is a painless test that uses sound waves to create images of your heart. It provides your doctor with information about the size and shape of your heart and how well your heart's chambers and valves are working. This procedure takes approximately one hour. There are no restrictions for this procedure. Please do NOT wear cologne, perfume, aftershave, or lotions (deodorant is allowed). Please arrive 15 minutes prior to your appointment time.    Follow-Up: At [facility], you and your health needs are our priority.  As part of our continuing mission to provide you with exceptional heart care, we have created designated Provider Care Teams.  These Care Teams include your primary Cardiologist (physician) and Advanced Practice Providers (APPs -  Physician Assistants and Nurse Practitioners) who all work together to provide you with the care you need, when you need it.  We recommend signing up for the patient portal called "MyChart".  Sign up information is provided on this After Visit Summary.  MyChart is used to connect with patients for Virtual Visits (Telemedicine).  Patients are able to view lab/test results, encounter notes, upcoming appointments, etc.  Non-urgent messages can be sent to your provider as well.    To learn more about what you can do with MyChart, go to [email or web address].    Your next appointment:   6 month(s)  Provider:   Dina Rich, MD    Other Instructions

## 2022-11-02 ENCOUNTER — Encounter (HOSPITAL_COMMUNITY): Payer: BC Managed Care – PPO

## 2022-11-07 ENCOUNTER — Encounter (HOSPITAL_COMMUNITY): Payer: BC Managed Care – PPO

## 2022-11-09 ENCOUNTER — Encounter (HOSPITAL_COMMUNITY): Payer: BC Managed Care – PPO

## 2022-11-14 ENCOUNTER — Encounter (HOSPITAL_COMMUNITY): Payer: BC Managed Care – PPO

## 2022-11-16 ENCOUNTER — Encounter (HOSPITAL_COMMUNITY): Payer: BC Managed Care – PPO

## 2022-11-28 ENCOUNTER — Ambulatory Visit: Payer: BC Managed Care – PPO | Admitting: Cardiology

## 2023-01-18 ENCOUNTER — Ambulatory Visit (HOSPITAL_COMMUNITY)
Admission: RE | Admit: 2023-01-18 | Discharge: 2023-01-18 | Disposition: A | Discharge status: Home / Self Care | Payer: BC Managed Care – PPO | Source: Ambulatory Visit | Attending: Cardiology | Admitting: Cardiology

## 2023-01-18 DIAGNOSIS — I513 Intracardiac thrombosis, not elsewhere classified: Secondary | ICD-10-CM | POA: Diagnosis not present

## 2023-01-18 LAB — ECHOCARDIOGRAM LIMITED: S' Lateral: 3.6 cm

## 2023-01-18 MED ORDER — PERFLUTREN LIPID MICROSPHERE
1.0000 mL | INTRAVENOUS | Status: AC | PRN
  Administered 2023-01-18: 2 mL via INTRAVENOUS

## 2023-01-18 NOTE — Progress Notes (Signed)
  Echocardiogram 2D Echocardiogram has been performed.  Janalyn Harder 01/18/2023, 4:30 PM

## 2023-02-11 ENCOUNTER — Telehealth: Payer: Self-pay | Admitting: Cardiology

## 2023-02-11 NOTE — Telephone Encounter (Signed)
Pt c/o of Chest Pain: STAT if CP now or developed within 24 hours  1. Are you having CP right now? No   2. Are you experiencing any other symptoms (ex. SOB, nausea, vomiting, sweating)? No   3. How long have you been experiencing CP? In the last week   4. Is your CP continuous or coming and going? Coming and going   5. Have you taken Nitroglycerin? Yes   Patient's wife would also like to discuss results of echo.  ?

## 2023-02-11 NOTE — Telephone Encounter (Signed)
I spoke with wife and reviewed echo results done on 01/18/23  She states Martin Hooper  read the echo results in MyChart but has not discussed with her. Dr.Branch advised continued use of Eliquis.  Wife reports in the past month Martin Hooper had had to use one NTG on three different occasions. She said until this month, he has never used NTG. We talking about monitoring NTG use and to let us know if frequency of use increases.  I will FYI Dr.Branch.

## 2023-02-12 NOTE — Telephone Encounter (Signed)
If ongoing chest pains can increase imdur to 120mg  daily, please clarify he has been taking the 90mg  daily  Dominga Ferry MD

## 2023-02-12 NOTE — Telephone Encounter (Signed)
I spoke with wife and she confirmed he is taking Imdur 90 mg. Wife states he has not had CP in the past 3 days so they will monitor for now

## 2023-02-13 ENCOUNTER — Other Ambulatory Visit: Payer: Self-pay | Admitting: Cardiology

## 2023-02-27 LAB — COLOGUARD: COLOGUARD: POSITIVE — AB

## 2023-03-19 ENCOUNTER — Ambulatory Visit: Payer: BC Managed Care – PPO | Admitting: Gastroenterology

## 2023-03-22 ENCOUNTER — Telehealth: Payer: Self-pay | Admitting: *Deleted

## 2023-03-22 ENCOUNTER — Encounter: Payer: Self-pay | Admitting: Gastroenterology

## 2023-03-22 ENCOUNTER — Ambulatory Visit (INDEPENDENT_AMBULATORY_CARE_PROVIDER_SITE_OTHER): Payer: BC Managed Care – PPO | Admitting: Gastroenterology

## 2023-03-22 VITALS — BP 116/60 | HR 52 | Temp 98.0°F | Ht 68.0 in | Wt 205.4 lb

## 2023-03-22 DIAGNOSIS — Z79899 Other long term (current) drug therapy: Secondary | ICD-10-CM | POA: Insufficient documentation

## 2023-03-22 DIAGNOSIS — R195 Other fecal abnormalities: Secondary | ICD-10-CM | POA: Diagnosis not present

## 2023-03-22 DIAGNOSIS — K862 Cyst of pancreas: Secondary | ICD-10-CM | POA: Diagnosis not present

## 2023-03-22 NOTE — Telephone Encounter (Signed)
Patient states that he would like to have the colonoscopy after his appointment with Dr. Wyline Mood on 05/08/23

## 2023-03-22 NOTE — Progress Notes (Signed)
GI Office Note    Referring Provider: Richardean Chimera, MD Primary Care Physician:  Richardean Chimera, MD  Primary Gastroenterologist: Roetta Sessions, MD   Chief Complaint   Chief Complaint  Patient presents with   Colonoscopy    Positive cologuard     History of Present Illness   Martin Hooper is a 72 y.o. male with PMH significant for alpha gal, CAD, CKD, DM, HTN, LV mural thrombus, MI in 08/2022 presenting due to positive Cologuard results.   He also has history of pancreatic and liver lesions.  States he has been on Eliquis and Plavix for about 7 months. He was noted to have LV thrombus on 07/2022 echo, also noted on repeat limited echo 08/2022. He had MI 08/2022, no PCI targets. Discharged on Eliquis (plans for 6 months with repeat echo 01/2023. Still very small residual clot noted in heart, continue Eliquis for now), Plavix (plans for 1 year). No history of PE, DVT.   He states he has history of internal hemorrhoids that bleed at times. No melena, BM 2-3 times daily. More frequent then usually but still stolid. Occasional fleeting left sided abdominal pain but nothing persisting. He is on OTC Nexium with good control of his reflux. No dysphagia. Reports EGD about 10 years ago at [facility] and states he did not have Barrett's.   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   He believes his last colonoscopy was about 10 years ago.   He is taking iron since he avoids red meats in setting of alpha Gal.     03/2022 CT Abd with and without contrast:  1. No abnormal contrast enhancement of the anterior liver dome to  correspond to suspected finding of prior MR, presumed artifactual  given motion limitations of prior MR.  2. Hyperenhancing focus of the inferior right lobe of the liver,  hepatic segment VI, measuring 0.9 cm, which fades to parenchymal  background on delayed phase imaging almost certainly a benign focal  nodular hyperplasia flash filling hemangioma. No  specific further  follow-up or characterization is required for this lesion. Attention  on follow-up.  3. 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 Follow-up recommendations as  previously reported.  4. Coronary artery disease.   MRI Abd with and without contrast 02/2022: 1. Slight decrease in size of the cystic lesion in the pancreatic  head/uncinate process measuring 14 cm without suspicious MRI  features likely reflecting a side branch IPMN. Recommend follow up  pre and post contrast MRI/MRCP in 1 year. This recommendation  follows ACR consensus guidelines: Management of Incidental  Pancreatic Cysts: A White Paper of the ACR Incidental Findings  Committee. J Am Coll Radiol 2017;14:911-923.  2. Possible ill-defined enhancing area in the hepatic dome measuring  2.8 cm in an enhancing 11 mm focus in the inferior right lobe of the  liver both of which are difficult to accurately characterize given  the motion degraded examination but neither of which definitely  demonstrate washout or discrete T2 correlate but without reliable  assessment of these lesions for reduced diffusivity given the  extensive motion on that pulse sequence. Nonspecific findings which  may reflect vascular shunts or artifact but a discrete hepatic  lesion is not excluded. Suggest follow-up hepatic mass protocol  abdominal CT with and without intravenous contrast for more  definitive assessment given the 2 recent sequential motion degraded  MRI  examinations and the location of the larger area in the hepatic  dome.     Medications   Current Outpatient Medications  Medication Sig Dispense Refill   amLODipine (NORVASC) 5 MG tablet Take 1 tablet (5 mg total) by mouth daily. 90 tablet 3   apixaban (ELIQUIS) 5 MG TABS tablet Take 1 tablet (5 mg total) by mouth 2 (two) times daily. 180 tablet 2   Ascorbic Acid (VITAMIN C) 1000 MG tablet Take 1,000 mg by  mouth daily.     atorvastatin (LIPITOR) 80 MG tablet Take 80 mg by mouth daily.     benazepril (LOTENSIN) 40 MG tablet Take 40 mg by mouth daily.     Cholecalciferol 125 MCG (5000 UT) TABS Take 5,000 Units by mouth daily.     clopidogrel (PLAVIX) 75 MG tablet Take 1 tablet by mouth once daily 30 tablet 5   Coenzyme Q10 100 MG TABS Take 100 mg by mouth daily.     diphenhydrAMINE (BENADRYL) 25 MG tablet Take 25 mg by mouth at bedtime.     esomeprazole (NEXIUM) 20 MG capsule Take 20 mg by mouth daily at 12 noon.     ferrous sulfate 325 (65 FE) MG tablet Take 325 mg by mouth daily with breakfast.     gabapentin (NEURONTIN) 100 MG capsule Take 300 mg by mouth at bedtime.     hydrochlorothiazide (HYDRODIURIL) 25 MG tablet Take 25 mg by mouth daily.     isosorbide mononitrate (IMDUR) 60 MG 24 hr tablet Take 1.5 tablets (90 mg total) by mouth daily. 135 tablet 3   metFORMIN (GLUCOPHAGE-XR) 500 MG 24 hr tablet Take 500-1,000 mg by mouth See admin instructions. Take 1000mg  in the AM and 500mg  in the PM.     metoprolol succinate (TOPROL-XL) 100 MG 24 hr tablet Take 100 mg by mouth daily.     nitroGLYCERIN (NITROSTAT) 0.4 MG SL tablet Place 1 tablet (0.4 mg total) under the tongue every 5 (five) minutes as needed for chest pain. 90 tablet 3   tamsulosin (FLOMAX) 0.4 MG CAPS capsule Take 0.4 mg by mouth in the morning and at bedtime.     tiZANidine (ZANAFLEX) 4 MG tablet Take 2 mg by mouth every 6 (six) hours as needed for muscle spasms.     TRESIBA FLEXTOUCH 200 UNIT/ML FlexTouch Pen Inject 68 Units into the skin at bedtime.     Zinc 30 MG CAPS Take 30 mg by mouth daily.     No current facility-administered medications for this visit.    Allergies   Allergies as of 03/22/2023 - Review Complete 03/22/2023  Allergen Reaction Noted   Alpha-gal Anaphylaxis 07/03/2022   Beef (bovine) protein Anaphylaxis 03/21/2012   Pork-derived products Anaphylaxis 03/21/2012    Past Medical History   Past Medical  History:  Diagnosis Date   Allergy to alpha-gal    CAD (coronary artery disease)    Chronic kidney disease, stage 3a (HCC)    Diabetes mellitus (HCC)    High cholesterol    Hypertension    Ischemic cardiomyopathy    LV (left ventricular) mural thrombus    MI, 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    Past Surgical History   Past Surgical History:  Procedure Laterality Date   CORONARY ARTERY BYPASS GRAFT     x 8 years ago    CORONARY/GRAFT ANGIOGRAPHY N/A 08/16/2022   Procedure: CORONARY/GRAFT ANGIOGRAPHY;  Surgeon: Kathleene Hazel, MD;  Location: [facility];  Service: Cardiovascular;  Laterality: N/A;   HERNIA REPAIR      Past Family History   Family History  Problem Relation Age of Onset   Diabetes Mother    Schizophrenia Mother    Cancer Father     Past Social History   Social History   Socioeconomic History   Marital status: Married    Spouse name: Not on file   Number of children: Not on file   Years of education: Not on file   Highest education level: Not on file  Occupational History   Not on file  Tobacco Use   Smoking status: Never   Smokeless tobacco: Never  Vaping Use   Vaping status: Never Used  Substance and Sexual Activity   Alcohol use: Never   Drug use: Never   Sexual activity: Not on file  Other Topics Concern   Not on file  Social History Narrative   Not on file   Social Determinants of Health   Financial Resource Strain: Not on file  Food Insecurity: No Food Insecurity (08/20/2022)   Hunger Vital Sign    Worried About Running Out of Food in the Last Year: Never true    Ran Out of Food in the Last Year: Never true  Transportation Needs: No Transportation Needs (08/20/2022)   PRAPARE - Administrator, Civil Service (Medical): No    Lack of Transportation (Non-Medical): No  Physical Activity: Not on file  Stress: Not on file  Social Connections: Unknown (11/16/2021)   [facility]   Social Network     Social Network: Not on file  Intimate Partner Violence: Not At Risk (08/20/2022)   Humiliation, Afraid, Rape, and Kick questionnaire    Fear of Current or Ex-Partner: No    Emotionally Abused: No    Physically Abused: No    Sexually Abused: No    Review of Systems   General: Negative for anorexia, weight loss, fever, chills, fatigue, weakness. Eyes: Negative for vision changes.  ENT: Negative for hoarseness, difficulty swallowing , nasal congestion. CV: Negative for chest pain, angina, palpitations, dyspnea on exertion, peripheral edema.  Respiratory: Negative for dyspnea at rest, dyspnea on exertion, cough, sputum, wheezing.  GI: See history of present illness. GU:  Negative for dysuria, hematuria, urinary incontinence, urinary frequency, nocturnal urination.  MS: Negative for joint pain, low back pain.  Derm: Negative for rash or itching.  Neuro: Negative for weakness, abnormal sensation, seizure, frequent headaches, memory loss,  confusion.  Psych: Negative for anxiety, depression, suicidal ideation, hallucinations.  Endo: Negative for unusual weight change.  Heme: Negative for bruising or bleeding. Allergy: Negative for rash or hives.  Physical Exam   BP 116/60 (BP Location: Right Arm, Patient Position: Sitting, Cuff Size: Normal)   Pulse (!) 52   Temp 98 F (36.7 C) (Oral)   Ht 5\' 8"  (1.727 m)   Wt 205 lb 6.4 oz (93.2 kg)   SpO2 95%   BMI 31.23 kg/m    General: Well-nourished, well-developed in no acute distress.  Head: Normocephalic, atraumatic.   Eyes: Conjunctiva pink, no icterus. Mouth: Oropharyngeal mucosa moist and pink  Neck: Supple without thyromegaly, masses, or lymphadenopathy.  Lungs: Clear to auscultation bilaterally.  Heart: Regular rate and rhythm, no murmurs rubs or gallops.  Abdomen: Bowel sounds are normal, nontender, nondistended, no hepatosplenomegaly or masses,  no abdominal bruits or hernia, no rebound or guarding.   Rectal: not  performed Extremities: No lower extremity edema. No clubbing  or deformities.  Neuro: Alert and oriented x 4 , grossly normal neurologically.  Skin: Warm and dry, no rash or jaundice.   Psych: Alert and cooperative, normal mood and affect.  Labs   Lab Results  Component Value Date   NA 138 08/22/2022   CL 103 08/22/2022   K 3.5 08/22/2022   CO2 24 08/22/2022   BUN 34 (H) 08/22/2022   CREATININE 1.64 (H) 08/22/2022   GFRNONAA 44 (L) 08/22/2022   CALCIUM 8.6 (L) 08/22/2022   ALBUMIN 3.5 08/20/2022   GLUCOSE 144 (H) 08/22/2022   Lab Results  Component Value Date   WBC 11.5 (H) 08/30/2022   HGB 12.4 (L) 08/30/2022   HCT 36.4 (L) 08/30/2022   MCV 86 08/30/2022   PLT 256 08/30/2022   No results found for: "IRON", "TIBC", "FERRITIN" No results found for: "VITAMINB12" No results found for: "FOLATE" Lab Results  Component Value Date   ALT 22 08/20/2022   AST 23 08/20/2022   ALKPHOS 52 08/20/2022   BILITOT 0.6 08/20/2022    Imaging Studies   No results found.  Assessment  *Positive Cologuard *High risk medication *Pancreatic cysts  Needs colonoscopy to evaluate positive Cologuard. He had LV thrombus and MI back in January and February respectively. Currently on Plavix and Eliquis. ECHO in 01/2023 with very tiny residual LV thrombus. No PCI targets on cath in 08/2022. We will need cardiac clearance for both medical and to hold Eliquis 48 hours before.  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 He prefers to follow with PCP but at anytime we would be happy to take over surveillance if he desires.      PLAN   Colonoscopy once cardiac clearance received, medical and medication. Need to hold Eliquis 48 hours before. ASA 3.  I have discussed the risks, alternatives, benefits with regards to but not limited to the risk of reaction to medication, bleeding, infection, perforation and the patient is agreeable to  proceed. Written consent to be obtained. He is due for one year surveillance MRI pancreas, he would like to continue to have PCP follow this. He can call if he needs assistance scheduling follow up MRI.    Leanna Battles. Melvyn Neth, MHS, PA-C [facility]

## 2023-03-22 NOTE — Patient Instructions (Signed)
We will call and get you scheduled for colonoscopy once we have cardiac clearance. Let me know if you need assistance will follow up MRI imaging of your pancreatic cysts. You can call my CMA Tammy at [phone] if you need assistance.

## 2023-03-22 NOTE — Telephone Encounter (Signed)
Patient with diagnosis of LV thrombus  on Eliquis for anticoagulation.    Procedure: colonoscopy  Date of procedure: TBD  CrCl 45 mL/min Platelet count 256 K   Per office protocol, patient can hold Eliquis for 1 days prior to procedure.     **This guidance is not considered finalized until pre-operative APP has relayed final recommendations.**

## 2023-03-22 NOTE — Telephone Encounter (Signed)
   Name: Martin Hooper  DOB: 1950/11/21  MRN: [id number]  Primary Cardiologist: Dina Rich, MD   Preoperative team, please contact this patient and set up a phone call appointment for further preoperative risk assessment. Please obtain consent and complete medication review. Thank you for your help.  I confirm that guidance regarding antiplatelet and oral anticoagulation therapy has been completed and, if necessary, noted below   Per Pharmacy  Per office protocol, patient can hold Eliquis for 1 days prior to procedure.   Joni Reining, NP 03/22/2023, 4:02 PM [facility]

## 2023-03-22 NOTE — Telephone Encounter (Signed)
Pharmacy please advise on holding Eliquis prior to colonoscopy scheduled for TBD. Thank you.   

## 2023-03-22 NOTE — Telephone Encounter (Signed)
  Request for patient to stop medication prior to procedure or is needing cleareance  03/22/23  SABASTIAN EVERAGE 1950-09-29  What type of surgery is being performed? colonoscopy  When is surgery scheduled? TBD  What type of clearance is required (medical or pharmacy to hold medication or both? Both   Patient is needing medical clearance and clearance to hold Elquis x 2 days prior to procedure.  Name of physician performing surgery?  Dr.Rourk [facility] at [employer]: [phone] Fax: [phone]  Anethesia type (none, local, MAC, general)? MAC

## 2023-04-12 ENCOUNTER — Ambulatory Visit: Payer: BC Managed Care – PPO | Admitting: Allergy & Immunology

## 2023-04-23 ENCOUNTER — Other Ambulatory Visit: Payer: Self-pay | Admitting: Internal Medicine

## 2023-04-23 NOTE — Telephone Encounter (Signed)
Prescription refill request for Eliquis received. Indication: LV thrombus Last office visit: 11/01/22  Dominga Ferry MD Scr: 1.64 on 08/22/22  Epic Age: 72 Weight: 93kg  Based on above findings Eliquis 5mg  twice daily is the appropriate dose.  Refill approved.

## 2023-05-08 ENCOUNTER — Encounter: Payer: Self-pay | Admitting: Cardiology

## 2023-05-08 ENCOUNTER — Ambulatory Visit: Payer: BC Managed Care – PPO | Attending: Cardiology | Admitting: Cardiology

## 2023-05-08 VITALS — BP 134/70 | HR 74 | Ht 68.0 in | Wt 205.0 lb

## 2023-05-08 DIAGNOSIS — I25118 Atherosclerotic heart disease of native coronary artery with other forms of angina pectoris: Secondary | ICD-10-CM | POA: Diagnosis not present

## 2023-05-08 DIAGNOSIS — E782 Mixed hyperlipidemia: Secondary | ICD-10-CM | POA: Diagnosis not present

## 2023-05-08 DIAGNOSIS — I4892 Unspecified atrial flutter: Secondary | ICD-10-CM

## 2023-05-08 DIAGNOSIS — I513 Intracardiac thrombosis, not elsewhere classified: Secondary | ICD-10-CM | POA: Diagnosis not present

## 2023-05-08 DIAGNOSIS — I10 Essential (primary) hypertension: Secondary | ICD-10-CM

## 2023-05-08 MED ORDER — APIXABAN 5 MG PO TABS: 5.0000 mg | ORAL_TABLET | Freq: Two times a day (BID) | ORAL | 0 refills | Status: DC

## 2023-05-08 MED ORDER — ISOSORBIDE MONONITRATE ER 120 MG PO TB24: 120.0000 mg | ORAL_TABLET | Freq: Every day | ORAL | 3 refills | Status: DC

## 2023-05-08 NOTE — Patient Instructions (Signed)
Medication Instructions:  Your physician has recommended you make the following change in your medication:   -Increase Imdur to 120 mg tablet once daily  *If you need a refill on your cardiac medications before your next appointment, please call your pharmacy*   Lab Work: None If you have labs (blood work) drawn today and your tests are completely normal, you will receive your results only by: MyChart Message (if you have MyChart) OR A paper copy in the mail If you have any lab test that is abnormal or we need to change your treatment, we will call you to review the results.   Testing/Procedures: None   Follow-Up: At [facility], you and your health needs are our priority.  As part of our continuing mission to provide you with exceptional heart care, we have created designated Provider Care Teams.  These Care Teams include your primary Cardiologist (physician) and Advanced Practice Providers (APPs -  Physician Assistants and Nurse Practitioners) who all work together to provide you with the care you need, when you need it.  We recommend signing up for the patient portal called "MyChart".  Sign up information is provided on this After Visit Summary.  MyChart is used to connect with patients for Virtual Visits (Telemedicine).  Patients are able to view lab/test results, encounter notes, upcoming appointments, etc.  Non-urgent messages can be sent to your provider as well.   To learn more about what you can do with MyChart, go to [email or web address].    Your next appointment:   2 month(s)  Provider:   You may see Dina Rich, MD or one of the following Advanced Practice Providers on your designated Care Team:   Randall An, PA-C  Jacolyn Reedy, [place]     Other Instructions Nurse Visit- 2 weeks for EKG and Vitals.

## 2023-05-08 NOTE — Progress Notes (Addendum)
Clinical Summary Mr. Chapla is a 72 y.o.male seen today for follow up of the following medical problems.   1.CAD - - history of CAD s/p PCI in 2000, 5V CABG  LIMA to LAD, SVG to OM 2, OM 3, D1, PDA  in 2013  08/16/2022 outpatient cath: mid LAD occluded, D2 occluded, LCX mid occlusion, RCA mid occlusion. 4/5 patent bypass grafts, distal limb of sequential SVG most distal OM Talbert Trembath is nearly occluded, this area had collaterals. No PCI targets.  - Jan 2024 echo limited LVEF 55-60%, apical akineis with ongoing apical thrombus   - admit 08/21/2022 with NSTEMI, I suspect subtotal occluded graft Felix Pratt progressed to complete occlusion. He was managed medically given cath findings just days prior     - some recent chest pains at about 1 block. Tightness 4/10 in severity, would resolved with NG. Would not reoccur later in the day. Later in the day walk up 2 flights of stairs without symptoms.    2. LV thrombus - noted on Jan 2024 echo, also noted on repeat limited 08/2022 echo  01/2023 echo: LVEF 55-60%,apical aneurysm, small residual thrombus noted.  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 Hyperlipidemia - 08/2022 TC 92 Tg 117 HDL 21 LDL 48 - 12/2022 TC 086 TG 578 HDL 31 LDL 60  5. Preoperative evaluation - needs colonscopy  6. Atrial flutter - new diagnosis today based on EKG - denies any symptoms - has already been on toprol for his CAD, on eliquis for LV thrombus        SH:  Works at [employer], calibrates gauges Past Medical History:  Diagnosis Date   Allergy to alpha-gal    CAD (coronary artery disease)    reports CAD stents in 2000, 5v CABG around 2013.   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 90 tablet 3   Ascorbic Acid (VITAMIN C) 1000 MG tablet Take 1,000 mg by mouth daily.     atorvastatin (LIPITOR) 80 MG tablet Take 80 mg by mouth daily.     BD PEN NEEDLE NANO 2ND GEN 32G X 4 MM MISC USE 1 TWICE DAILY TO GIVE INSULIN     benazepril (LOTENSIN) 40 MG tablet Take 40 mg by mouth daily.     Cholecalciferol 125 MCG (5000 UT) TABS Take 5,000 Units by mouth daily.     clopidogrel (PLAVIX) 75 MG tablet Take 1 tablet by mouth once daily 30 tablet 5   Coenzyme Q10 100 MG TABS Take 100 mg by mouth daily.     diphenhydrAMINE (BENADRYL) 25 MG tablet Take 25 mg by mouth at bedtime.     doxycycline (VIBRA-TABS) 100 MG tablet Take 100 mg by mouth 2 (two) times daily.     esomeprazole (NEXIUM) 20  MG capsule Take 20 mg by mouth daily at 12 noon.     ferrous sulfate 325 (65 FE) MG tablet Take 325 mg by mouth daily with breakfast.     guaiFENesin-codeine 100-10 MG/5ML syrup SMARTSIG:2.5-4 Milliliter(s) By Mouth 4 Times Daily PRN     hydrochlorothiazide (HYDRODIURIL) 25 MG tablet Take 25 mg by mouth daily.     isosorbide mononitrate (IMDUR) 120 MG 24 hr tablet Take 1 tablet (120 mg total) by mouth daily. 90 tablet 3   metFORMIN (GLUCOPHAGE-XR) 500 MG 24 hr tablet Take 500-1,000 mg by mouth See admin instructions. Take 1000mg  in the AM and 500mg  in the PM.     metoprolol succinate (TOPROL-XL) 100 MG 24 hr tablet Take 100 mg by mouth daily.     nitroGLYCERIN (NITROSTAT) 0.4 MG SL tablet Place 1 tablet (0.4 mg total) under the tongue every 5 (five) minutes as needed for chest pain. 90 tablet 3   tamsulosin (FLOMAX) 0.4 MG CAPS capsule Take 0.4 mg by mouth in the morning and at bedtime.     tiZANidine (ZANAFLEX)  4 MG tablet Take 2 mg by mouth every 6 (six) hours as needed for muscle spasms.     TRESIBA FLEXTOUCH 200 UNIT/ML FlexTouch Pen Inject 68 Units into the skin at bedtime.     Zinc 30 MG CAPS Take 30 mg by mouth daily.     apixaban (ELIQUIS) 5 MG TABS tablet Take 1 tablet (5 mg total) by mouth 2 (two) times daily. 28 tablet 0   gabapentin (NEURONTIN) 100 MG capsule Take 300 mg by mouth at bedtime. (Patient not taking: Reported on 05/08/2023)     No current facility-administered medications for this visit.     Past Surgical History:  Procedure Laterality Date   CAD stents  2000   CORONARY ARTERY BYPASS GRAFT  2013   x 8 years ago    CORONARY/GRAFT ANGIOGRAPHY N/A 08/16/2022   Procedure: CORONARY/GRAFT ANGIOGRAPHY;  Surgeon: Kathleene Hazel, MD;  Location: [facility];  Service: Cardiovascular;  Laterality: N/A;   HERNIA REPAIR       Allergies  Allergen Reactions   Alpha-Gal Anaphylaxis    Any mammal meat    Beef (Bovine) Protein Anaphylaxis   Pork-Derived Products Anaphylaxis      Family History  Problem Relation Age of Onset   Diabetes Mother    Schizophrenia Mother    Cancer Father      Social History Mr. Kirley reports that he has never smoked. He has never used smokeless tobacco. Mr. Thakore reports no history of alcohol use.   Review of Systems CONSTITUTIONAL: No weight loss, fever, chills, weakness or fatigue.  HEENT: Eyes: No visual loss, blurred vision, double vision or yellow sclerae.No hearing loss, sneezing, congestion, runny nose or sore throat.  SKIN: No rash or itching.  CARDIOVASCULAR: per hpi RESPIRATORY: No shortness of breath, cough or sputum.  GASTROINTESTINAL: No anorexia, nausea, vomiting or diarrhea. No abdominal pain or blood.  GENITOURINARY: No burning on urination, no polyuria NEUROLOGICAL: No headache, dizziness, syncope, paralysis, ataxia, numbness or tingling in the extremities. No change in bowel or bladder control.   MUSCULOSKELETAL: No muscle, back pain, joint pain or stiffness.  LYMPHATICS: No enlarged nodes. No history of splenectomy.  PSYCHIATRIC: No history of depression or anxiety.  ENDOCRINOLOGIC: No reports of sweating, cold or heat intolerance. No polyuria or polydipsia.  Marland Kitchen   Physical Examination Today's Vitals   05/08/23 1135 05/08/23 1228  BP: 138/78 134/70  Pulse: 74   SpO2: 94%   Weight: 205 lb (93 kg)   Height: 5\' 8"  (1.727 m)    Body mass index is 31.17 kg/m.  Gen: resting comfortably, no acute distress HEENT: no scleral icterus, pupils equal round and reactive, no palptable cervical adenopathy,  CV: irreg, no mr/g, no jvd Resp: Clear to auscultation bilaterally GI: abdomen is soft, non-tender, non-distended, normal bowel sounds, no hepatosplenomegaly MSK: extremities are warm, no edema.  Skin: warm, no rash Neuro:  no focal deficits Psych: appropriate affect   Diagnostic Studies 01/2023 echo  1. Limited study with Definity contrast.   2. Very small, intermittently seen apical LV thrombus noted with  surrounding slow flow and thrombotic material. 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   3. Left ventricular ejection fraction, by estimation, is 55 to 60%. The  left ventricle has normal function. The left ventricle demonstrates  regional wall motion abnormalities (see scoring diagram/findings for  description). There is mild concentric left  ventricular hypertrophy.   4. Right ventricular systolic function is normal. The right ventricular  size is normal.   5. The mitral valve is grossly normal. Trivial mitral valve  regurgitation.   6. The aortic valve is tricuspid. Aortic valve regurgitation is not  visualized.   7. Aortic dilatation noted. There is mild dilatation of the aortic root,  measuring 40 mm.   8. The inferior vena cava is normal in size with greater than 50%  respiratory variability, suggesting right atrial pressure of 3 mmHg.    Assessment and Plan  1.CAD  with chronic stable angina - medically managed NSTEMI as reported above, no PCI targets - continue plavix for 1 year, has been on eliquis as well for LV thrombus. Can d/c plavix 08/2023 - some ongoing chronic stable exertional chest pains, will increase imdur to 120mg  daily.    2. LV thrombus - smaller but not resolved with 6 months of anticoag, plan for indefinite anticoag   3. HTN - at goal, continue current meds   4. Hyperlipidemia - LDL had been at goal of <55, uptrend from most recent labs - monitor at this time, if further uptrend change atorva to crestor  5. Aflutter - new diagnosis today in clinic, EKG shows rate controlled aflutter - no symptoms - has already been on toprol which looks to be controlling rates. Already on eliquis for history of LV thrombus - recent pneumonia still on antibiotics, perhaps the trigger - nursing visit 2 weeks for EKG. May consider home monitor as well to monitor persistence and rate control. If rate controlled and no symptoms over time no strong indication to consider cardioversion.   6. Preoperative evaluation - ok to proceed with colonscopy - can hold eliquis 2 days prior, resume day after - can hold plavix 5 days prior, resume day after if needed   2 month f/u reassess chest pain and aflutter  Antoine Poche, M.D.

## 2023-05-10 ENCOUNTER — Telehealth: Payer: Self-pay | Admitting: Gastroenterology

## 2023-05-10 NOTE — Telephone Encounter (Signed)
-----   Message from Dina Rich sent at 05/08/2023 12:45 PM EDT ----- He is ok to proceed with colonscopy. Hold eliquis 2 days prior resume day after. Hold plavix 5 days prior resume day after.  Dominga Ferry MD

## 2023-05-10 NOTE — Telephone Encounter (Signed)
See notation by Dr. Wyline Mood.  Ok to hold Eliquis 48 hours.  Ok to proceed with colonoscopy per orders provided previously.  Make sure he holds iron 7 days.  You should have instructions for metformin and tresiba.  No need to hold plavix.

## 2023-05-13 ENCOUNTER — Encounter: Payer: Self-pay | Admitting: *Deleted

## 2023-05-13 ENCOUNTER — Other Ambulatory Visit: Payer: Self-pay | Admitting: *Deleted

## 2023-05-13 MED ORDER — PEG 3350-KCL-NA BICARB-NACL 420 G PO SOLR: 4000.0000 mL | Freq: Once | ORAL | 0 refills | Status: AC

## 2023-05-13 NOTE — Telephone Encounter (Signed)
[employer] PA:  Approved Authorization #[id number]  Tracking #[id number] Dates of service 06/21/2023 - 09/19/2023

## 2023-05-13 NOTE — Telephone Encounter (Signed)
Pt has been scheduled for 06/21/23. Instructions mailed and prep sent to the pharmacy

## 2023-05-23 ENCOUNTER — Telehealth: Payer: Self-pay | Admitting: *Deleted

## 2023-05-23 NOTE — Telephone Encounter (Signed)
Pt's wife left vm that pt would like to have different prep.   Called and spoke with pt. He said his wife had a prep that she done the night before and was only 2 small bottles. Informed him that provider wants him to have trilyte prep due to his kidney issues. He states he will give it a try. He will call back if any problems or concerns.

## 2023-05-24 ENCOUNTER — Ambulatory Visit: Payer: BC Managed Care – PPO | Attending: Cardiology

## 2023-05-24 VITALS — BP 136/64 | HR 58

## 2023-05-24 DIAGNOSIS — I4892 Unspecified atrial flutter: Secondary | ICD-10-CM

## 2023-05-24 NOTE — Progress Notes (Signed)
Nurse visit today   EKG done,patient back in SR, EKG reviewed by Dr.Branch, recommend no change in medications, f/u with APP Patient already has f/u appointment in December

## 2023-05-24 NOTE — Patient Instructions (Signed)
Medication Instructions:  Your physician recommends that you continue on your current medications as directed. Please refer to the Current Medication list given to you today.   Labwork: None today  Testing/Procedures: None today  Follow-Up: Keep follow up already scheduled in December  Any Other Special Instructions Will Be Listed Below (If Applicable).  If you need a refill on your cardiac medications before your next appointment, please call your pharmacy.

## 2023-06-17 ENCOUNTER — Telehealth: Payer: Self-pay

## 2023-06-17 NOTE — Telephone Encounter (Signed)
Spoke with pt. He wants to cancel for Friday. 1) he has another doc appt, 2) he doesn't really want to be on a clear liquid doet for that long and he is a diabetic. I advised of what he can have to help with his blood sugars. He stated he still wanted to cancel. Advised will call back with January schedule and he said he will let us know at that time if he wants to reschedule.

## 2023-06-17 NOTE — Telephone Encounter (Signed)
Pt called lmom requesting a return call regarding his colonoscopy that is scheduled for this Friday.

## 2023-06-17 NOTE — Patient Instructions (Signed)
[facility]  06/17/2023     @PREFPERIOPPHARMACY @   Your procedure is scheduled on  06/21/2023.   Report to [facility] at  0915  A.M.   Call this number if you have problems the morning of surgery:  [phone]  If you experience any cold or flu symptoms such as cough, fever, chills, shortness of breath, etc. between now and your scheduled surgery, please notify us at the above number.   Remember:  Your last dose of iron should have been on 06/13/2023.    Your last dose of eliquis should be on 06/18/2023.      Take 24 units of tresiba the night before your procedure.       DO NOT take any medications for diabetes the morning of your procedure.       Follow the diet and pre instructions given to you by the office.   You may drink clear liquids until 0715 am on 06/21/2023.   Clear liquids allowed are:                    Water, Juice (No red color; non-citric and without pulp; diabetics please choose diet or no sugar options), Carbonated beverages (diabetics please choose diet or no sugar options), Clear Tea (No creamer, milk, or cream, including half & half and powdered creamer), Black Coffee Only (No creamer, milk or cream, including half & half and powdered creamer), and Clear Sports drink (No red color; diabetics please choose diet or no sugar options)    Take these medicines the morning of surgery with A SIP OF WATER            amlodipine, esomeprazole, isosorbide, metoprolol, tizanidine(if needed).      Do not wear jewelry, make-up or nail polish, including gel polish,  artificial nails, or any other type of covering on natural nails (fingers and  toes).  Do not wear lotions, powders, or perfumes, or deodorant.  Do not shave 48 hours prior to surgery.  Men may shave face and neck.  Do not bring valuables to the hospital.  [facility] is not responsible for any belongings or valuables.  Contacts, dentures or bridgework may not be worn into surgery.  Leave  your suitcase in the car.  After surgery it may be brought to your room.  For patients admitted to the hospital, discharge time will be determined by your treatment team.  Patients discharged the day of surgery will not be allowed to drive home and must have someone with them for 24 hours.    Special instructions:   DO NOT smoke tobacco or vape for 24 hours before your procedure.  Please read over the following fact sheets that you were given. Anesthesia Post-op Instructions and Care and Recovery After Surgery      Colonoscopy, Adult, Care After The following information offers guidance on how to care for yourself after your procedure. Your health care provider may also give you more specific instructions. If you have problems or questions, contact your health care provider. What can I expect after the procedure? After the procedure, it is common to have: A small amount of blood in your stool for 24 hours after the procedure. Some gas. Mild cramping or bloating of your abdomen. Follow these instructions at home: Eating and drinking  Drink enough fluid to keep your urine pale yellow. Follow instructions from your health care provider about eating or drinking restrictions. Resume your normal  diet as told by your health care provider. Avoid heavy or fried foods that are hard to digest. Activity Rest as told by your health care provider. Avoid sitting for a long time without moving. Get up to take short walks every 1-2 hours. This is important to improve blood flow and breathing. Ask for help if you feel weak or unsteady. Return to your normal activities as told by your health care provider. Ask your health care provider what activities are safe for you. Managing cramping and bloating  Try walking around when you have cramps or feel bloated. If directed, apply heat to your abdomen as told by your health care provider. Use the heat source that your health care provider recommends, such  as a moist heat pack or a heating pad. Place a towel between your skin and the heat source. Leave the heat on for 20-30 minutes. Remove the heat if your skin turns bright red. This is especially important if you are unable to feel pain, heat, or cold. You have a greater risk of getting burned. General instructions If you were given a sedative during the procedure, it can affect you for several hours. Do not drive or operate machinery until your health care provider says that it is safe. For the first 24 hours after the procedure: Do not sign important documents. Do not drink alcohol. Do your regular daily activities at a slower pace than normal. Eat soft foods that are easy to digest. Take over-the-counter and prescription medicines only as told by your health care provider. Keep all follow-up visits. This is important. Contact a health care provider if: You have blood in your stool 2-3 days after the procedure. Get help right away if: You have more than a small spotting of blood in your stool. You have large blood clots in your stool. You have swelling of your abdomen. You have nausea or vomiting. You have a fever. You have increasing pain in your abdomen that is not relieved with medicine. These symptoms may be an emergency. Get help right away. Call 911. Do not wait to see if the symptoms will go away. Do not drive yourself to the hospital. Summary After the procedure, it is common to have a small amount of blood in your stool. You may also have mild cramping and bloating of your abdomen. If you were given a sedative during the procedure, it can affect you for several hours. Do not drive or operate machinery until your health care provider says that it is safe. Get help right away if you have a lot of blood in your stool, nausea or vomiting, a fever, or increased pain in your abdomen. This information is not intended to replace advice given to you by your health care provider. Make  sure you discuss any questions you have with your health care provider. Document Revised: 08/14/2022 Document Reviewed: 02/22/2021 Elsevier Patient Education  2024 [employer] Monitored Anesthesia Care, Care After The following information offers guidance on how to care for yourself after your procedure. Your health care provider may also give you more specific instructions. If you have problems or questions, contact your health care provider. What can I expect after the procedure? After the procedure, it is common to have: Tiredness. Little or no memory about what happened during or after the procedure. Impaired judgment when it comes to making decisions. Nausea or vomiting. Some trouble with balance. Follow these instructions at home: For the time period you were told by your health  care provider:  Rest. Do not participate in activities where you could fall or become injured. Do not drive or use machinery. Do not drink alcohol. Do not take sleeping pills or medicines that cause drowsiness. Do not make important decisions or sign legal documents. Do not take care of children on your own. Medicines Take over-the-counter and prescription medicines only as told by your health care provider. If you were prescribed antibiotics, take them as told by your health care provider. Do not stop using the antibiotic even if you start to feel better. Eating and drinking Follow instructions from your health care provider about what you may eat and drink. Drink enough fluid to keep your urine pale yellow. If you vomit: Drink clear fluids slowly and in small amounts as you are able. Clear fluids include water, ice chips, low-calorie sports drinks, and fruit juice that has water added to it (diluted fruit juice). Eat light and bland foods in small amounts as you are able. These foods include bananas, applesauce, rice, lean meats, toast, and crackers. General instructions  Have a responsible adult  stay with you for the time you are told. It is important to have someone help care for you until you are awake and alert. If you have sleep apnea, surgery and some medicines can increase your risk for breathing problems. Follow instructions from your health care provider about wearing your sleep device: When you are sleeping. This includes during daytime naps. While taking prescription pain medicines, sleeping medicines, or medicines that make you drowsy. Do not use any products that contain nicotine or tobacco. These products include cigarettes, chewing tobacco, and vaping devices, such as e-cigarettes. If you need help quitting, ask your health care provider. Contact a health care provider if: You feel nauseous or vomit every time you eat or drink. You feel light-headed. You are still sleepy or having trouble with balance after 24 hours. You get a rash. You have a fever. You have redness or swelling around the IV site. Get help right away if: You have trouble breathing. You have new confusion after you get home. These symptoms may be an emergency. Get help right away. Call 911. Do not wait to see if the symptoms will go away. Do not drive yourself to the hospital. This information is not intended to replace advice given to you by your health care provider. Make sure you discuss any questions you have with your health care provider. Document Revised: 11/27/2021 Document Reviewed: 11/27/2021 Elsevier Patient Education  2024 [employer].

## 2023-06-19 ENCOUNTER — Encounter (HOSPITAL_COMMUNITY)
Admission: RE | Admit: 2023-06-19 | Discharge: 2023-06-19 | Disposition: A | Discharge status: Home / Self Care | Payer: BC Managed Care – PPO | Source: Ambulatory Visit | Attending: Internal Medicine | Admitting: Internal Medicine

## 2023-06-19 ENCOUNTER — Encounter (HOSPITAL_COMMUNITY): Payer: Self-pay

## 2023-06-21 ENCOUNTER — Encounter (HOSPITAL_COMMUNITY): Payer: Self-pay

## 2023-06-21 ENCOUNTER — Ambulatory Visit (HOSPITAL_COMMUNITY): Admit: 2023-06-21 | Payer: BC Managed Care – PPO | Admitting: Internal Medicine

## 2023-06-21 SURGERY — COLONOSCOPY WITH PROPOFOL
Anesthesia: Monitor Anesthesia Care

## 2023-06-24 NOTE — Telephone Encounter (Signed)
Called pt and advised we have January schedule. He stated he would call us when he was ready to reschedule

## 2023-06-27 NOTE — Telephone Encounter (Signed)
error 

## 2023-07-12 ENCOUNTER — Ambulatory Visit: Payer: BC Managed Care – PPO | Admitting: Student

## 2023-07-31 ENCOUNTER — Other Ambulatory Visit: Payer: Self-pay | Admitting: Internal Medicine

## 2023-08-09 ENCOUNTER — Encounter: Payer: Self-pay | Admitting: Student

## 2023-08-09 ENCOUNTER — Ambulatory Visit: Payer: Self-pay | Attending: Student | Admitting: Student

## 2023-08-09 VITALS — BP 120/60 | HR 51 | Ht 68.0 in | Wt 206.2 lb

## 2023-08-09 DIAGNOSIS — I25118 Atherosclerotic heart disease of native coronary artery with other forms of angina pectoris: Secondary | ICD-10-CM | POA: Diagnosis not present

## 2023-08-09 DIAGNOSIS — I10 Essential (primary) hypertension: Secondary | ICD-10-CM | POA: Diagnosis not present

## 2023-08-09 DIAGNOSIS — I513 Intracardiac thrombosis, not elsewhere classified: Secondary | ICD-10-CM | POA: Diagnosis not present

## 2023-08-09 DIAGNOSIS — I5032 Chronic diastolic (congestive) heart failure: Secondary | ICD-10-CM

## 2023-08-09 DIAGNOSIS — R200 Anesthesia of skin: Secondary | ICD-10-CM

## 2023-08-09 DIAGNOSIS — Z136 Encounter for screening for cardiovascular disorders: Secondary | ICD-10-CM

## 2023-08-09 DIAGNOSIS — E782 Mixed hyperlipidemia: Secondary | ICD-10-CM

## 2023-08-09 NOTE — Patient Instructions (Signed)
Medication Instructions:   Your physician has recommended you make the following change in your medication:   Stop Taking Plavix after current supply is complete.   *If you need a refill on your cardiac medications before your next appointment, please call your pharmacy*   Lab Work: NONE   If you have labs (blood work) drawn today and your tests are completely normal, you will receive your results only by: MyChart Message (if you have MyChart) OR A paper copy in the mail If you have any lab test that is abnormal or we need to change your treatment, we will call you to review the results.   Testing/Procedures: Your physician has requested that you have a carotid duplex. This test is an ultrasound of the carotid arteries in your neck. It looks at blood flow through these arteries that supply the brain with blood. Allow one hour for this exam. There are no restrictions or special instructions.    Follow-Up: At [facility], you and your health needs are our priority.  As part of our continuing mission to provide you with exceptional heart care, we have created designated Provider Care Teams.  These Care Teams include your primary Cardiologist (physician) and Advanced Practice Providers (APPs -  Physician Assistants and Nurse Practitioners) who all work together to provide you with the care you need, when you need it.  We recommend signing up for the patient portal called "MyChart".  Sign up information is provided on this After Visit Summary.  MyChart is used to connect with patients for Virtual Visits (Telemedicine).  Patients are able to view lab/test results, encounter notes, upcoming appointments, etc.  Non-urgent messages can be sent to your provider as well.   To learn more about what you can do with MyChart, go to [email or web address].    Your next appointment:   6 month(s)  Provider:   You may see Dina Rich, MD or one of the following Advanced Practice  Providers on your designated Care Team:   Randall An, PA-C  Jacolyn Reedy, [place]     Other Instructions Thank you for choosing [facility]!

## 2023-08-09 NOTE — Progress Notes (Unsigned)
Cardiology Office Note    Date:  08/10/2023  ID:  Tareek, Sabo 26-Oct-1950, MRN [id number] Cardiologist: Dina Rich, MD    History of Present Illness:    JASTIN FORE is a 73 y.o. male with past medical history of CAD (s/p PCI in 2000, 5V CABG in 2013 with LIMA-LAD, seq SVG-OM2-OM3, SVG-D1, and SVG-PDA, cath in 08/2022 showing 4/5 patent grafts and no targets for PCI), chronic HFimpEF, HTN, HLD, Type 2 DM, Stage 3 CKD and history of LV thrombus who presents to the office today for 50-month follow-up.  He was examined by Dr. Wyline Mood in 04/2023 and reported more recent chest pains which would occur with activity and would improve with rest. At the time of his office visit, he was also found to be in atrial flutter which was a new diagnosis for him at that time and he denied any associated symptoms. Imdur was titrated to 120 mg daily. In regards to his atrial flutter, rates were well-controlled and he was continued on Toprol-XL and Eliquis for anticoagulation. He did have a nurse visit on 05/24/2023 and had converted back to normal sinus rhythm and no changes were made to his medications.  In talking with the patient and his wife today, he denies any recurrent chest pain since Imdur was recently increased. Breathing has been stable with no specific orthopnea, PND or pitting edema. Reports occasional ankle edema and does use salt routinely. Was asymptomatic with his atrial flutter and denies any palpitations with this. Remains on Eliquis for anticoagulation and no reports of active bleeding. He is concerned about his carotid arteries as he reports his mother and aunt had carotid artery disease. Says he occasionally experiences facial numbness which spontaneously resolves and no associated neurological deficits.   Studies Reviewed:   EKG: EKG is not ordered today. EKG from 05/24/2023 is reviewed and shows NSR, HR 60 with slightly TWI along lateral leads (noted on prior tracings).    Cardiac Catheterization: 08/2022   Prox RCA lesion is 50% stenosed.   Mid RCA lesion is 100% stenosed.   2nd Diag lesion is 100% stenosed.   Mid Cx lesion is 100% stenosed.   Mid Graft to Insertion lesion between 2nd Mrg and 3rd Mrg  is 99% stenosed.   Mid LAD lesion is 100% stenosed.   SVG graft was visualized by angiography.   SVG graft was visualized by angiography.   LIMA graft was visualized by non-selective angiography.   Severe three vessel CAD s/p 5V CABG with 4/5 patent bypass grafts Chronic occlusion mid LAD. The mid and distal LAD fills from the patent LIMA graft.The Diagonal branch fills from the patent vein graft The Circumflex is totally occluded in the mid segment. The sequential vein graft to the second and third OM branches is patent to the second OM branch but the distal limb of this graft to the small third OM branch is occluded. There is collateral filling of this third OM branch from collaterals supplied through the SVG to Diagonal.  The large dominant RCA is occluded in the mid segment. Patent vein graft to the distal RCA   Recommendations: No focal targets for PCI. I suspect that his recent change in symptoms is related to the occlusion of the distal limb of the sequential vein graft to the most distal OM branch. This small branch fills from left to left collaterals. Continue medical management of CAD.   Limited Echocardiogram: 01/2023 IMPRESSIONS     1. Limited study  with Definity contrast.   2. Very small, intermittently seen apical LV thrombus noted with  surrounding slow flow and thrombotic material. 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   3. Left ventricular ejection fraction, by estimation, is 55 to 60%. The  left ventricle has normal function. The left ventricle demonstrates  regional wall motion abnormalities (see scoring diagram/findings for  description). There is mild concentric left  ventricular hypertrophy.   4. Right ventricular systolic function is  normal. The right ventricular  size is normal.   5. The mitral valve is grossly normal. Trivial mitral valve  regurgitation.   6. The aortic valve is tricuspid. Aortic valve regurgitation is not  visualized.   7. Aortic dilatation noted. There is mild dilatation of the aortic root,  measuring 40 mm.   8. The inferior vena cava is normal in size with greater than 50%  respiratory variability, suggesting right atrial pressure of 3 mmHg.   Comparison(s): Prior images reviewed side by side. Formed apical LV  thrombus has decreased in size.    Risk Assessment/Calculations:    CHA2DS2-VASc Score = 5   This indicates a 7.2% annual risk of stroke. The patient's score is based upon: CHF History: 1 HTN History: 1 Diabetes History: 1 Stroke History: 0 Vascular Disease History: 1 Age Score: 1 Gender Score: 0              Physical Exam:   VS:  BP 120/60 (BP Location: Right Arm, Patient Position: Sitting, Cuff Size: Normal)   Pulse (!) 51   Ht 5\' 8"  (1.727 m)   Wt 206 lb 3.2 oz (93.5 kg)   SpO2 95%   BMI 31.35 kg/m    Wt Readings from Last 3 Encounters:  08/09/23 206 lb 3.2 oz (93.5 kg)  05/08/23 205 lb (93 kg)  03/22/23 205 lb 6.4 oz (93.2 kg)     GEN: Well nourished, well developed male appearing in no acute distress NECK: No JVD; No carotid bruits CARDIAC: RRR, no murmurs, rubs, gallops RESPIRATORY:  Clear to auscultation without rales, wheezing or rhonchi  ABDOMEN: Appears non-distended. No obvious abdominal masses. EXTREMITIES: No clubbing or cyanosis. Trace edema bilaterally, L>R.  Distal pedal pulses are 2+ bilaterally.   Assessment and Plan:   1. CAD - He is s/p CABG in 2013 and cath in 08/2022 showing 4/5 patent grafts and no targets for PCI with medical management recommended. He is overall doing well and denies any recurrent anginal symptoms.  - Continue current medical therapy with Atorvastatin 80mg  daily, Imdur 120mg  daily and Toprol-XL 100mg  daily. He is  not on ASA given the need for anticoagulation.   2. Chronic HFimpEF - His EF had normalized to 55-60% by most recent imaging. Does have trace edema on examination today and we reviewed the importance of reducing his salt intake (has been using Himalayan salt). - Continue Benazepril 40mg  daily, Hydrochlorothiazide 25mg  daily and Toprol-XL 100mg  daily. If he developed recurrent cardiomyopathy, would switch Hydrochlorothiazide to Spironolactone.   3. HTN - BP is well-controlled at 120/60 during today's visit. Continue current medical therapy.   4. History of LV Thrombus - No reports of active bleeding. Remains on Eliquis 5mg  BID for anticoagulation. CBC in 12/2022 showed stable Hgb and platelets by review of [facility] DXA.   5. HLD - LDL was at 60 in 12/2022. Continue Atorvastatin 80mg  daily.   6. History of Facial Numbness/Screening for Carotid Artery Disease - Will obtain a carotid doppler study for further assessment.  Signed, Ellsworth Lennox, PA-C

## 2023-08-10 ENCOUNTER — Encounter: Payer: Self-pay | Admitting: Student

## 2023-08-14 ENCOUNTER — Other Ambulatory Visit: Payer: Self-pay | Admitting: Cardiology

## 2023-08-15 ENCOUNTER — Ambulatory Visit (HOSPITAL_COMMUNITY)
Admission: RE | Admit: 2023-08-15 | Discharge: 2023-08-15 | Disposition: A | Discharge status: Home / Self Care | Payer: PPO | Source: Ambulatory Visit | Attending: Student | Admitting: Student

## 2023-08-15 DIAGNOSIS — Z136 Encounter for screening for cardiovascular disorders: Secondary | ICD-10-CM | POA: Diagnosis present

## 2023-08-15 DIAGNOSIS — R200 Anesthesia of skin: Secondary | ICD-10-CM | POA: Insufficient documentation

## 2023-10-13 ENCOUNTER — Other Ambulatory Visit: Payer: Self-pay | Admitting: Cardiology

## 2023-10-14 NOTE — Telephone Encounter (Signed)
 Prescription refill request for Eliquis received. Indication: A Flutter /LV thrombus Last office visit: 08/09/23  B Strader PA-C Scr: 1.8 on 06/14/23  KPN Age: 73 Weight: 93.5kg  Based on above findings Eliquis 5mg  twice daily is the appropriate dose.  Refill approved.

## 2023-10-25 ENCOUNTER — Other Ambulatory Visit: Payer: Self-pay | Admitting: Internal Medicine

## 2023-11-15 ENCOUNTER — Other Ambulatory Visit: Payer: Self-pay | Admitting: *Deleted

## 2023-11-15 MED ORDER — NITROGLYCERIN 0.4 MG SL SUBL: 0.4000 mg | SUBLINGUAL_TABLET | SUBLINGUAL | 3 refills | Status: DC | PRN

## 2023-11-18 ENCOUNTER — Telehealth: Payer: Self-pay | Admitting: Cardiology

## 2023-11-18 NOTE — Telephone Encounter (Signed)
   Pt c/o of Chest Pain: STAT if active CP, including tightness, pressure, jaw pain, radiating pain to shoulder/upper arm/back, CP unrelieved by Nitro. Symptoms reported of SOB, nausea, vomiting, sweating.  1. Are you having CP right now? No     2. Are you experiencing any other symptoms (ex. SOB, nausea, vomiting, sweating)? No    3. Is your CP continuous or coming and going? Coming and going    4. Have you taken Nitroglycerin ? Yes    5. How long have you been experiencing CP? One week     6. If NO CP at time of call then end call with telling Pt to call back or call 911 if Chest pain returns prior to return call from triage team.

## 2023-11-18 NOTE — Telephone Encounter (Signed)
 For the past week, he has had to take one NTG when he finds himself working outside in the garden and experiences CP, No DOE,swelling in lower extremities is unchanged per patient. He is currently without pain.He is asking if his Imdur  can be increased. This is occurring about every other day. Appointment made tomorrow with E.Peck,NP at 11 am on 11/19/23.  Patient verbalized understanding to go to the ED if he uses more than to take 3 NTG at one time.

## 2023-11-19 ENCOUNTER — Other Ambulatory Visit: Payer: Self-pay | Admitting: Nurse Practitioner

## 2023-11-19 ENCOUNTER — Other Ambulatory Visit

## 2023-11-19 ENCOUNTER — Ambulatory Visit: Attending: Nurse Practitioner | Admitting: Nurse Practitioner

## 2023-11-19 ENCOUNTER — Telehealth: Payer: Self-pay | Admitting: Nurse Practitioner

## 2023-11-19 ENCOUNTER — Encounter: Payer: Self-pay | Admitting: Nurse Practitioner

## 2023-11-19 VITALS — BP 137/59 | HR 44 | Ht 68.0 in | Wt 209.8 lb

## 2023-11-19 DIAGNOSIS — R079 Chest pain, unspecified: Secondary | ICD-10-CM | POA: Diagnosis not present

## 2023-11-19 DIAGNOSIS — I513 Intracardiac thrombosis, not elsewhere classified: Secondary | ICD-10-CM

## 2023-11-19 DIAGNOSIS — R1012 Left upper quadrant pain: Secondary | ICD-10-CM

## 2023-11-19 DIAGNOSIS — E785 Hyperlipidemia, unspecified: Secondary | ICD-10-CM

## 2023-11-19 DIAGNOSIS — R001 Bradycardia, unspecified: Secondary | ICD-10-CM

## 2023-11-19 DIAGNOSIS — R0789 Other chest pain: Secondary | ICD-10-CM | POA: Diagnosis not present

## 2023-11-19 DIAGNOSIS — Z8679 Personal history of other diseases of the circulatory system: Secondary | ICD-10-CM

## 2023-11-19 DIAGNOSIS — I5032 Chronic diastolic (congestive) heart failure: Secondary | ICD-10-CM

## 2023-11-19 DIAGNOSIS — I25119 Atherosclerotic heart disease of native coronary artery with unspecified angina pectoris: Secondary | ICD-10-CM | POA: Diagnosis not present

## 2023-11-19 DIAGNOSIS — I10 Essential (primary) hypertension: Secondary | ICD-10-CM

## 2023-11-19 MED ORDER — ISOSORBIDE MONONITRATE ER 30 MG PO TB24: 30.0000 mg | ORAL_TABLET | Freq: Every day | ORAL | 1 refills | Status: DC

## 2023-11-19 MED ORDER — METOPROLOL SUCCINATE ER 50 MG PO TB24: 50.0000 mg | ORAL_TABLET | Freq: Every day | ORAL | 1 refills | Status: DC

## 2023-11-19 NOTE — Patient Instructions (Addendum)
 Medication Instructions:  Your physician has recommended you make the following change in your medication:  Please increase IMDUR  to an additional 30 Mg tablet in the evening  Please reduce metoprolol  to 50 Mg daily   Labwork: None   Testing/Procedures: Your physician has recommended that you wear a Zio monitor.   This monitor is a medical device that records the heart's electrical activity. Doctors most often use these monitors to diagnose arrhythmias. Arrhythmias are problems with the speed or rhythm of the heartbeat. The monitor is a small device applied to your chest. You can wear one while you do your normal daily activities. While wearing this monitor if you have any symptoms to push the button and record what you felt. Once you have worn this monitor for the period of time provider prescribed (for 7 days), you will return the monitor device in the postage paid box. Once it is returned they will download the data collected and provide us  with a report which the provider will then review and we will call you with those results. Important tips:  Avoid showering during the first 24 hours of wearing the monitor. Avoid excessive sweating to help maximize wear time. Do not submerge the device, no hot tubs, and no swimming pools. Keep any lotions or oils away from the patch. After 24 hours you may shower with the patch on. Take brief showers with your back facing the shower head.  Do not remove patch once it has been placed because that will interrupt data and decrease adhesive wear time. Push the button when you have any symptoms and write down what you were feeling. Once you have completed wearing your monitor, remove and place into box which has postage paid and place in your outgoing mailbox.  If for some reason you have misplaced your box then call our office and we can provide another box and/or mail it off for you.  Follow-Up: Your physician recommends that you schedule a follow-up  appointment in: 6 weeks   Any Other Special Instructions Will Be Listed Below (If Applicable).  If you need a refill on your cardiac medications before your next appointment, please call your pharmacy.

## 2023-11-19 NOTE — Progress Notes (Signed)
 Cardiology Office Note:  .   Date: 11/19/2023 ID:  NIHAN HOLZKNECHT, DOB 12/01/1950, MRN [id number] PCP: Leesa Pulling, MD  [facility] Providers Cardiologist:  Armida Lander, MD    History of Present Illness: [name]   CORDIS CONDER is a 73 y.o. male with a PMH of CAD, s/p CABG, HFimpEF, HTN, HLD, CKD stage 3, type 2 diabetes, hx of A-flutter, and hx of LV thrombus, who presents today for follow-up.   History of PCI in 2000.  Underwent 5 vessel CABG in 2013 with LIMA-LAD, sequential SVG-OM2-OM3, SVG-D1, and SVG-PDA.   Evaluated in October 2020 for an patient noted recent chest pains at the time, seem to occur with activity and improved with rest.  Found to be in a flutter that was newly diagnosed for him, was asymptomatic.  Imdur  increased to 120 mg daily.  Heart rate well-controlled while in a flutter.  Nurse visit in November 2024 showed patient had self converted to normal sinus rhythm.  Last seen by [name], PA-C on August 09, 2023.  Denied any recurrent chest pain.  Noted occasional ankle edema, asymptomatic with a flutter.  Was concerned about carotid arteries, had family history with mother and aunt having known carotid artery disease.  Noted occasional facial numbness that resolved spontaneously, no neurological deficits per his report.  See carotid Doppler report noted below.  Today he presents for chest pain evaluation.  He describes it as left upper quadrant abdominal pain, believes this is due to gas/indigestion and says this radiates up to left axillary area.  Denies any overt chest pain.  He said this has been ongoing intermittently since he was 73 years old. Denies any chest pain, shortness of breath, palpitations, syncope, presyncope, dizziness, orthopnea, PND, swelling or significant weight changes, acute bleeding, or claudication.  ROS: Negative.  See HPI.  Studies Reviewed: [name]    EKG: EKG Interpretation Date/Time:  Tuesday Nov 19 2023 10:49:58  EDT Ventricular Rate:  44 PR Interval:  210 QRS Duration:  100 QT Interval:  458 QTC Calculation: 391 R Axis:   -21  Text Interpretation: Marked sinus bradycardia with 1st degree A-V block T wave abnormality, consider lateral ischemia When compared with ECG of 24-May-2023 15:37, Premature atrial complexes are no longer Present Confirmed by Lasalle Pointer [phone]) on 11/19/2023 11:19:50 AM   Carotid duplex 07/2023: IMPRESSION: Right:   Heterogeneous and partially calcified plaque at the right carotid bifurcation, with discordant results regarding degree of stenosis by established duplex criteria. Peak velocity suggests 50%-69% stenosis, with the ICA/ CCA ratio suggesting a lesser degree of stenosis. If establishing a more accurate degree of stenosis is required, cerebral angiogram should be considered, or as a second best test, CTA.   Left:   Color duplex indicates minimal heterogeneous and calcified plaque, with no hemodynamically significant stenosis by duplex criteria in the extracranial cerebrovascular circulation.  Limited echo 01/2023: 1. Limited study with Definity  contrast.   2. Very small, intermittently seen apical LV thrombus noted with  surrounding slow flow and thrombotic material. 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   3. Left ventricular ejection fraction, by estimation, is 55 to 60%. The  left ventricle has normal function. The left ventricle demonstrates  regional wall motion abnormalities (see scoring diagram/findings for  description). There is mild concentric left  ventricular hypertrophy.   4. Right ventricular systolic function is normal. The right ventricular  size is normal.   5. The mitral valve is grossly normal. Trivial mitral valve  regurgitation.   6. The aortic valve is tricuspid. Aortic valve regurgitation is not  visualized.   7. Aortic dilatation noted. There is mild dilatation of the aortic root,  measuring 40 mm.   8. The inferior vena cava is  normal in size with greater than 50%  respiratory variability, suggesting right atrial pressure of 3 mmHg.   Comparison(s): Prior images reviewed side by side. Formed apical LV  thrombus has decreased in size.  Left heart cath 08/2022:   Prox RCA lesion is 50% stenosed.   Mid RCA lesion is 100% stenosed.   2nd Diag lesion is 100% stenosed.   Mid Cx lesion is 100% stenosed.   Mid Graft to Insertion lesion between 2nd Mrg and 3rd Mrg  is 99% stenosed.   Mid LAD lesion is 100% stenosed.   SVG graft was visualized by angiography.   SVG graft was visualized by angiography.   LIMA graft was visualized by non-selective angiography.   Severe three vessel CAD s/p 5V CABG with 4/5 patent bypass grafts Chronic occlusion mid LAD. The mid and distal LAD fills from the patent LIMA graft.The Diagonal branch fills from the patent vein graft The Circumflex is totally occluded in the mid segment. The sequential vein graft to the second and third OM branches is patent to the second OM branch but the distal limb of this graft to the small third OM branch is occluded. There is collateral filling of this third OM branch from collaterals supplied through the SVG to Diagonal.  The large dominant RCA is occluded in the mid segment. Patent vein graft to the distal RCA   Recommendations: No focal targets for PCI. I suspect that his recent change in symptoms is related to the occlusion of the distal limb of the sequential vein graft to the most distal OM branch. This small branch fills from left to left collaterals. Continue medical management of CAD.   Lexiscan  07/2022:   Findings are consistent with inferior infarction with moderate to high amount of peri-infarct ischemia. 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 The study is intermediate to high risk based on degree of inferior peri-infarct ischemia and decreased LVEF   No ST deviation was noted.   LV perfusion is abnormal. Large moderate to severe intensity inferior defect with  moderate to high level of reversibility. Large severe intensity fixed apical defect.   Left ventricular function is abnormal. Nuclear stress EF: 47 %. The left ventricular ejection fraction is mildly decreased (45-54%). End diastolic cavity size is mildly enlarged.  Physical Exam:   VS:  BP (!) 137/59   Pulse (!) 44   Ht 5\' 8"  (1.727 m)   Wt 209 lb 12.8 oz (95.2 kg)   SpO2 95%   BMI 31.90 kg/m    Wt Readings from Last 3 Encounters:  11/19/23 209 lb 12.8 oz (95.2 kg)  08/09/23 206 lb 3.2 oz (93.5 kg)  05/08/23 205 lb (93 kg)    GEN: Obese, 73 year old male in no acute distress NECK: No JVD; No carotid bruits CARDIAC: S1/S2, slow rate and regular rhythm,, no murmurs, rubs, gallops RESPIRATORY:  Clear to auscultation without rales, wheezing or rhonchi  ABDOMEN: Soft, non-tender, non-distended EXTREMITIES:  No edema; No deformity   ASSESSMENT AND PLAN: .    1.  CAD, s/p CABG, atypical chest pain Very atypical presentation. Heart cath in 08/2022 revealed severe 3 vessel CAD with 4/5 patent bypass grafts. See full report above. There were no focal targets for PCI. Denies any exertional component  to his symptoms with LUQ pain radiating to left axillary area. No acute ischemic changes noted on EKG today. Pt wonders if it is related to his heart as after increasing Imdur  at previous office visit helped improve his symptoms. Will have him take an additional 30 mg of Imdur  nightly, continue 120 mg of Imdur  in AM.  Not on aspirin  due to being on Eliquis .  Continue atorvastatin , benazepril , coenzyme Q 10, and NTG PRN.  Reducing Toprol -XL as noted below.  Heart healthy diet and regular cardiovascular exercise encouraged. Care and ED precautions discussed.   2. HFimpEF, hx of LV thrombus Stage C, NYHA class I-II symptoms. EF 55-60%. Euvolemic and well compensated on exam.  Echocardiogram from July 2024 showed very small, intermittently seen apical LV thrombus noted with surrounding slow flow and  thrombotic material, apical segment was found to be aneurysmal.  Continue Eliquis  5 mg twice daily.  Reducing Toprol  XL to 50 mg daily-see below.  Continue rest of medication regimen.  Increasing Imdur  as noted above. Low sodium diet, fluid restriction <2L, and daily weights encouraged. Educated to contact our office for weight gain of 2 lbs overnight or 5 lbs in one week.  3. Bradycardia Heart rate in mid to upper 40s today.  He is asymptomatic with this.  Will reduce Toprol -XL to 50 mg daily.  Will arrange 1 week ZIO monitor for further evaluation. Heart healthy diet and regular cardiovascular exercise encouraged.  Care and ED precautions discussed.  4. HTN Blood pressure on recheck is at goal.  Increasing Imdur  and reducing Toprol -XL due to some bradycardia as mentioned above.  No other medication changes at this time. Discussed to monitor BP at home at least 2 hours after medications and sitting for 5-10 minutes. Heart healthy diet and regular cardiovascular exercise encouraged.   4. HLD LDL 60 12/2022.  Will request most recent labs from PCP. Continue current medication regimen. Heart healthy diet and regular cardiovascular exercise encouraged.   5. Hx of A-flutter Denies any tachycardia or palpitations.  He is in sinus bradycardia on exam today.  Decreasing Toprol -XL as mentioned above.  Continue Eliquis  as noted above.  No other medication changes at this time. Heart healthy diet and regular cardiovascular exercise encouraged.   3. Left upper quadrant abdominal pain Etiology unclear.  Not in acute distress today.  Recommend to follow-up with PCP for further evaluation.  Has seen GI in the past.  Recommended continue to follow-up with GI and PCP.  Care and ED precautions discussed.    Dispo: Follow-up with me/APP in 6 weeks or sooner if anything changes.  Signed, Lasalle Pointer, NP

## 2023-11-19 NOTE — Telephone Encounter (Signed)
 Checking percert on the following patient for   LONG TERM MONITOR-LIVE TELEMETRY (3-14 DAYS

## 2023-11-20 ENCOUNTER — Encounter: Payer: Self-pay | Admitting: Nurse Practitioner

## 2023-11-20 NOTE — Progress Notes (Signed)
 After hours call from [employer]:  Report of first documented afib/flutter, rate controlled  Per previous documentation, known diagnosis and on DOAC.  Alerting ordering provider but no changes anticipated.

## 2023-11-21 ENCOUNTER — Encounter: Payer: Self-pay | Admitting: Nurse Practitioner

## 2023-12-09 ENCOUNTER — Ambulatory Visit: Payer: Self-pay | Admitting: Nurse Practitioner

## 2023-12-31 ENCOUNTER — Ambulatory Visit: Attending: Nurse Practitioner | Admitting: Nurse Practitioner

## 2023-12-31 ENCOUNTER — Encounter: Payer: Self-pay | Admitting: Nurse Practitioner

## 2023-12-31 VITALS — BP 116/68 | HR 68 | Ht 68.0 in | Wt 209.8 lb

## 2023-12-31 DIAGNOSIS — I2510 Atherosclerotic heart disease of native coronary artery without angina pectoris: Secondary | ICD-10-CM | POA: Diagnosis not present

## 2023-12-31 DIAGNOSIS — I513 Intracardiac thrombosis, not elsewhere classified: Secondary | ICD-10-CM

## 2023-12-31 DIAGNOSIS — R0789 Other chest pain: Secondary | ICD-10-CM

## 2023-12-31 DIAGNOSIS — I5032 Chronic diastolic (congestive) heart failure: Secondary | ICD-10-CM

## 2023-12-31 DIAGNOSIS — E785 Hyperlipidemia, unspecified: Secondary | ICD-10-CM

## 2023-12-31 DIAGNOSIS — Z8679 Personal history of other diseases of the circulatory system: Secondary | ICD-10-CM

## 2023-12-31 DIAGNOSIS — R1012 Left upper quadrant pain: Secondary | ICD-10-CM

## 2023-12-31 DIAGNOSIS — I10 Essential (primary) hypertension: Secondary | ICD-10-CM

## 2023-12-31 NOTE — Patient Instructions (Addendum)

## 2023-12-31 NOTE — Progress Notes (Unsigned)
 Cardiology Office Note:  .   Date: 11/19/2023 ID:  Martin Hooper, DOB Dec 26, 1950, MRN [id number] PCP: Leesa Pulling, MD  [facility] Providers Cardiologist:  Armida Lander, MD    History of Present Illness: Martin Hooper   Martin Hooper is a 73 y.o. male with a PMH of CAD, s/p CABG, HFimpEF, HTN, HLD, CKD stage 3, type 2 diabetes, hx of A-flutter, and hx of LV thrombus, who presents today for follow-up.   History of PCI in 2000.  Underwent 5 vessel CABG in 2013 with LIMA-LAD, sequential SVG-OM2-OM3, SVG-D1, and SVG-PDA.   Evaluated in October 2020 for an patient noted recent chest pains at the time, seem to occur with activity and improved with rest.  Found to be in a flutter that was newly diagnosed for him, was asymptomatic.  Imdur  increased to 120 mg daily.  Heart rate well-controlled while in a flutter.  Nurse visit in November 2024 showed patient had self converted to normal sinus rhythm.  Last seen by [name], PA-C on August 09, 2023.  Denied any recurrent chest pain.  Noted occasional ankle edema, asymptomatic with a flutter.  Was concerned about carotid arteries, had family history with mother and aunt having known carotid artery disease.  Noted occasional facial numbness that resolved spontaneously, no neurological deficits per his report.  See carotid Doppler report noted below.  Today he presents for chest pain evaluation.  He describes it as left upper quadrant abdominal pain, believes this is due to gas/indigestion and says this radiates up to left axillary area.  Denies any overt chest pain.  He said this has been ongoing intermittently since he was 73 years old. Denies any chest pain, shortness of breath, palpitations, syncope, presyncope, dizziness, orthopnea, PND, swelling or significant weight changes, acute bleeding, or claudication.  ROS: Negative.  See HPI.  Studies Reviewed: Martin Hooper    EKG:     Carotid duplex 07/2023: IMPRESSION: Right:   Heterogeneous  and partially calcified plaque at the right carotid bifurcation, with discordant results regarding degree of stenosis by established duplex criteria. Peak velocity suggests 50%-69% stenosis, with the ICA/ CCA ratio suggesting a lesser degree of stenosis. If establishing a more accurate degree of stenosis is required, cerebral angiogram should be considered, or as a second best test, CTA.   Left:   Color duplex indicates minimal heterogeneous and calcified plaque, with no hemodynamically significant stenosis by duplex criteria in the extracranial cerebrovascular circulation.  Limited echo 01/2023: 1. Limited study with Definity  contrast.   2. Very small, intermittently seen apical LV thrombus noted with  surrounding slow flow and thrombotic material. 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   3. Left ventricular ejection fraction, by estimation, is 55 to 60%. The  left ventricle has normal function. The left ventricle demonstrates  regional wall motion abnormalities (see scoring diagram/findings for  description). There is mild concentric left  ventricular hypertrophy.   4. Right ventricular systolic function is normal. The right ventricular  size is normal.   5. The mitral valve is grossly normal. Trivial mitral valve  regurgitation.   6. The aortic valve is tricuspid. Aortic valve regurgitation is not  visualized.   7. Aortic dilatation noted. There is mild dilatation of the aortic root,  measuring 40 mm.   8. The inferior vena cava is normal in size with greater than 50%  respiratory variability, suggesting right atrial pressure of 3 mmHg.   Comparison(s): Prior images reviewed side by side. Formed apical LV  thrombus has decreased in size.  Left heart cath 08/2022:   Prox RCA lesion is 50% stenosed.   Mid RCA lesion is 100% stenosed.   2nd Diag lesion is 100% stenosed.   Mid Cx lesion is 100% stenosed.   Mid Graft to Insertion lesion between 2nd Mrg and 3rd Mrg  is 99% stenosed.    Mid LAD lesion is 100% stenosed.   SVG graft was visualized by angiography.   SVG graft was visualized by angiography.   LIMA graft was visualized by non-selective angiography.   Severe three vessel CAD s/p 5V CABG with 4/5 patent bypass grafts Chronic occlusion mid LAD. The mid and distal LAD fills from the patent LIMA graft.The Diagonal branch fills from the patent vein graft The Circumflex is totally occluded in the mid segment. The sequential vein graft to the second and third OM branches is patent to the second OM branch but the distal limb of this graft to the small third OM branch is occluded. There is collateral filling of this third OM branch from collaterals supplied through the SVG to Diagonal.  The large dominant RCA is occluded in the mid segment. Patent vein graft to the distal RCA   Recommendations: No focal targets for PCI. I suspect that his recent change in symptoms is related to the occlusion of the distal limb of the sequential vein graft to the most distal OM branch. This small branch fills from left to left collaterals. Continue medical management of CAD.   Lexiscan  07/2022:   Findings are consistent with inferior infarction with moderate to high amount of peri-infarct ischemia. 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 The study is intermediate to high risk based on degree of inferior peri-infarct ischemia and decreased LVEF   No ST deviation was noted.   LV perfusion is abnormal. Large moderate to severe intensity inferior defect with moderate to high level of reversibility. Large severe intensity fixed apical defect.   Left ventricular function is abnormal. Nuclear stress EF: 47 %. The left ventricular ejection fraction is mildly decreased (45-54%). End diastolic cavity size is mildly enlarged.  Physical Exam:   VS:  There were no vitals taken for this visit.   Wt Readings from Last 3 Encounters:  11/19/23 209 lb 12.8 oz (95.2 kg)  08/09/23 206 lb 3.2 oz (93.5 kg)  05/08/23 205 lb (93 kg)     GEN: Obese, 73 year old male in no acute distress NECK: No JVD; No carotid bruits CARDIAC: S1/S2, slow rate and regular rhythm,, no murmurs, rubs, gallops RESPIRATORY:  Clear to auscultation without rales, wheezing or rhonchi  ABDOMEN: Soft, non-tender, non-distended EXTREMITIES:  No edema; No deformity   ASSESSMENT AND PLAN: .    1.  CAD, s/p CABG, atypical chest pain Very atypical presentation. Heart cath in 08/2022 revealed severe 3 vessel CAD with 4/5 patent bypass grafts. See full report above. There were no focal targets for PCI. Denies any exertional component to his symptoms with LUQ pain radiating to left axillary area. No acute ischemic changes noted on EKG today. Pt wonders if it is related to his heart as after increasing Imdur  at previous office visit helped improve his symptoms. Will have him take an additional 30 mg of Imdur  nightly, continue 120 mg of Imdur  in AM.  Not on aspirin  due to being on Eliquis .  Continue atorvastatin , benazepril , coenzyme Q 10, and NTG PRN.  Reducing Toprol -XL as noted below.  Heart healthy diet and regular cardiovascular exercise encouraged. Care and ED precautions discussed.  2. HFimpEF, hx of LV thrombus Stage C, NYHA class I-II symptoms. EF 55-60%. Euvolemic and well compensated on exam.  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  Continue Eliquis  5 mg twice daily.  Reducing Toprol  XL to 50 mg daily-see below.  Continue rest of medication regimen.  Increasing Imdur  as noted above. Low sodium diet, fluid restriction <2L, and daily weights encouraged. Educated to contact our office for weight gain of 2 lbs overnight or 5 lbs in one week.  3. Bradycardia Heart rate in mid to upper 40s today.  He is asymptomatic with this.  Will reduce Toprol -XL to 50 mg daily.  Will arrange 1 week ZIO monitor for further  evaluation. Heart healthy diet and regular cardiovascular exercise encouraged.  Care and ED precautions discussed.  4. HTN Blood pressure on recheck is at goal.  Increasing Imdur  and reducing Toprol -XL due to some bradycardia as mentioned above.  No other medication changes at this time. Discussed to monitor BP at home at least 2 hours after medications and sitting for 5-10 minutes. Heart healthy diet and regular cardiovascular exercise encouraged.   4. HLD LDL 60 12/2022.  Will request most recent labs from PCP. Continue current medication regimen. Heart healthy diet and regular cardiovascular exercise encouraged.   5. Hx of A-flutter Denies any tachycardia or palpitations.  He is in sinus bradycardia on exam today.  Decreasing Toprol -XL as mentioned above.  Continue Eliquis  as noted above.  No other medication changes at this time. Heart healthy diet and regular cardiovascular exercise encouraged.   3. Left upper quadrant abdominal pain Etiology unclear.  Not in acute distress today.  Recommend to follow-up with PCP for further evaluation.  Has seen GI in the past.  Recommended continue to follow-up with GI and PCP.  Care and ED precautions discussed.    Dispo: Follow-up with me/APP in 6 weeks or sooner if anything changes.  Signed, Lasalle Pointer, NP

## 2024-02-03 DIAGNOSIS — R001 Bradycardia, unspecified: Secondary | ICD-10-CM

## 2024-02-21 ENCOUNTER — Other Ambulatory Visit (HOSPITAL_COMMUNITY): Payer: Self-pay | Admitting: Nephrology

## 2024-02-21 DIAGNOSIS — E1122 Type 2 diabetes mellitus with diabetic chronic kidney disease: Secondary | ICD-10-CM

## 2024-02-21 DIAGNOSIS — N1832 Chronic kidney disease, stage 3b: Secondary | ICD-10-CM

## 2024-02-27 ENCOUNTER — Ambulatory Visit (HOSPITAL_COMMUNITY)

## 2024-02-28 ENCOUNTER — Ambulatory Visit (HOSPITAL_COMMUNITY)
Admission: RE | Admit: 2024-02-28 | Discharge: 2024-02-28 | Disposition: A | Discharge status: Home / Self Care | Source: Ambulatory Visit | Attending: Nephrology | Admitting: Nephrology

## 2024-02-28 DIAGNOSIS — N1832 Chronic kidney disease, stage 3b: Secondary | ICD-10-CM | POA: Diagnosis present

## 2024-02-28 DIAGNOSIS — E1122 Type 2 diabetes mellitus with diabetic chronic kidney disease: Secondary | ICD-10-CM | POA: Diagnosis present

## 2024-03-03 ENCOUNTER — Other Ambulatory Visit: Payer: Self-pay | Admitting: Nurse Practitioner

## 2024-03-26 ENCOUNTER — Ambulatory Visit: Payer: Self-pay | Admitting: Nurse Practitioner

## 2024-04-16 ENCOUNTER — Observation Stay (HOSPITAL_COMMUNITY)

## 2024-04-16 ENCOUNTER — Observation Stay (HOSPITAL_COMMUNITY)
Admission: EM | Admit: 2024-04-16 | Discharge: 2024-04-17 | Disposition: A | Discharge status: Home / Self Care | Attending: Family Medicine | Admitting: Family Medicine

## 2024-04-16 ENCOUNTER — Other Ambulatory Visit: Payer: Self-pay

## 2024-04-16 ENCOUNTER — Emergency Department (HOSPITAL_COMMUNITY)

## 2024-04-16 ENCOUNTER — Other Ambulatory Visit (HOSPITAL_COMMUNITY)

## 2024-04-16 ENCOUNTER — Other Ambulatory Visit (HOSPITAL_COMMUNITY): Payer: Self-pay | Admitting: *Deleted

## 2024-04-16 ENCOUNTER — Encounter (HOSPITAL_COMMUNITY): Payer: Self-pay

## 2024-04-16 DIAGNOSIS — R079 Chest pain, unspecified: Secondary | ICD-10-CM

## 2024-04-16 DIAGNOSIS — I214 Non-ST elevation (NSTEMI) myocardial infarction: Principal | ICD-10-CM | POA: Insufficient documentation

## 2024-04-16 DIAGNOSIS — Z7901 Long term (current) use of anticoagulants: Secondary | ICD-10-CM | POA: Diagnosis not present

## 2024-04-16 DIAGNOSIS — I25709 Atherosclerosis of coronary artery bypass graft(s), unspecified, with unspecified angina pectoris: Secondary | ICD-10-CM | POA: Insufficient documentation

## 2024-04-16 DIAGNOSIS — I482 Chronic atrial fibrillation, unspecified: Secondary | ICD-10-CM | POA: Diagnosis not present

## 2024-04-16 DIAGNOSIS — E785 Hyperlipidemia, unspecified: Secondary | ICD-10-CM | POA: Diagnosis not present

## 2024-04-16 DIAGNOSIS — I509 Heart failure, unspecified: Secondary | ICD-10-CM | POA: Insufficient documentation

## 2024-04-16 DIAGNOSIS — I513 Intracardiac thrombosis, not elsewhere classified: Secondary | ICD-10-CM | POA: Diagnosis not present

## 2024-04-16 DIAGNOSIS — N1831 Chronic kidney disease, stage 3a: Secondary | ICD-10-CM | POA: Insufficient documentation

## 2024-04-16 DIAGNOSIS — Z794 Long term (current) use of insulin: Secondary | ICD-10-CM | POA: Insufficient documentation

## 2024-04-16 DIAGNOSIS — Z7982 Long term (current) use of aspirin: Secondary | ICD-10-CM | POA: Insufficient documentation

## 2024-04-16 DIAGNOSIS — Z79899 Other long term (current) drug therapy: Secondary | ICD-10-CM | POA: Insufficient documentation

## 2024-04-16 DIAGNOSIS — I200 Unstable angina: Secondary | ICD-10-CM

## 2024-04-16 DIAGNOSIS — Z7902 Long term (current) use of antithrombotics/antiplatelets: Secondary | ICD-10-CM

## 2024-04-16 DIAGNOSIS — R0789 Other chest pain: Secondary | ICD-10-CM | POA: Diagnosis not present

## 2024-04-16 DIAGNOSIS — E119 Type 2 diabetes mellitus without complications: Secondary | ICD-10-CM

## 2024-04-16 DIAGNOSIS — Z951 Presence of aortocoronary bypass graft: Secondary | ICD-10-CM | POA: Diagnosis not present

## 2024-04-16 DIAGNOSIS — I10 Essential (primary) hypertension: Secondary | ICD-10-CM

## 2024-04-16 DIAGNOSIS — I130 Hypertensive heart and chronic kidney disease with heart failure and stage 1 through stage 4 chronic kidney disease, or unspecified chronic kidney disease: Secondary | ICD-10-CM | POA: Insufficient documentation

## 2024-04-16 DIAGNOSIS — E1122 Type 2 diabetes mellitus with diabetic chronic kidney disease: Secondary | ICD-10-CM | POA: Diagnosis not present

## 2024-04-16 LAB — TROPONIN T, HIGH SENSITIVITY
Troponin T High Sensitivity: 26 ng/L — ABNORMAL HIGH (ref 0–19)
Troponin T High Sensitivity: 38 ng/L — ABNORMAL HIGH (ref 0–19)
Troponin T High Sensitivity: 52 ng/L — ABNORMAL HIGH (ref 0–19)
Troponin T High Sensitivity: 73 ng/L — ABNORMAL HIGH (ref 0–19)
Troponin T High Sensitivity: 93 ng/L — ABNORMAL HIGH (ref 0–19)
Troponin T High Sensitivity: 99 ng/L (ref 0–19)

## 2024-04-16 LAB — ECHOCARDIOGRAM COMPLETE
Area-P 1/2: 4.8 cm2
Est EF: 50
Height: 68 in
S' Lateral: 3.4 cm
Weight: 3200 [oz_av]

## 2024-04-16 LAB — CBC WITH DIFFERENTIAL/PLATELET
Abs Immature Granulocytes: 0.03 K/uL (ref 0.00–0.07)
Basophils Absolute: 0.1 K/uL (ref 0.0–0.1)
Basophils Relative: 1 %
Eosinophils Absolute: 0.3 K/uL (ref 0.0–0.5)
Eosinophils Relative: 2 %
HCT: 40.2 % (ref 39.0–52.0)
Hemoglobin: 13.2 g/dL (ref 13.0–17.0)
Immature Granulocytes: 0 %
Lymphocytes Relative: 13 %
Lymphs Abs: 1.7 K/uL (ref 0.7–4.0)
MCH: 28.8 pg (ref 26.0–34.0)
MCHC: 32.8 g/dL (ref 30.0–36.0)
MCV: 87.6 fL (ref 80.0–100.0)
Monocytes Absolute: 0.8 K/uL (ref 0.1–1.0)
Monocytes Relative: 6 %
Neutro Abs: 10.5 K/uL — ABNORMAL HIGH (ref 1.7–7.7)
Neutrophils Relative %: 78 %
Platelets: 261 K/uL (ref 150–400)
RBC: 4.59 MIL/uL (ref 4.22–5.81)
RDW: 13.7 % (ref 11.5–15.5)
WBC: 13.3 K/uL — ABNORMAL HIGH (ref 4.0–10.5)
nRBC: 0 % (ref 0.0–0.2)

## 2024-04-16 LAB — BASIC METABOLIC PANEL WITH GFR
Anion gap: 12 (ref 5–15)
BUN: 25 mg/dL — ABNORMAL HIGH (ref 8–23)
CO2: 25 mmol/L (ref 22–32)
Calcium: 9.1 mg/dL (ref 8.9–10.3)
Chloride: 102 mmol/L (ref 98–111)
Creatinine, Ser: 1.63 mg/dL — ABNORMAL HIGH (ref 0.61–1.24)
GFR, Estimated: 44 mL/min — ABNORMAL LOW (ref >60)
Glucose, Bld: 288 mg/dL — ABNORMAL HIGH (ref 70–99)
Potassium: 4.1 mmol/L (ref 3.5–5.1)
Sodium: 139 mmol/L (ref 135–145)

## 2024-04-16 LAB — LIPID PANEL
Cholesterol: 90 mg/dL (ref 0–200)
HDL: 31 mg/dL — ABNORMAL LOW (ref >40)
LDL Cholesterol: 41 mg/dL (ref 0–99)
Total CHOL/HDL Ratio: 2.9 ratio
Triglycerides: 93 mg/dL (ref <150)
VLDL: 19 mg/dL (ref 0–40)

## 2024-04-16 LAB — GLUCOSE, CAPILLARY
Glucose-Capillary: 70 mg/dL (ref 70–99)
Glucose-Capillary: 336 mg/dL — ABNORMAL HIGH (ref 70–99)

## 2024-04-16 LAB — COMPREHENSIVE METABOLIC PANEL WITH GFR
ALT: 19 U/L (ref 0–44)
AST: 24 U/L (ref 15–41)
Albumin: 4 g/dL (ref 3.5–5.0)
Alkaline Phosphatase: 68 U/L (ref 38–126)
Anion gap: 13 (ref 5–15)
BUN: 29 mg/dL — ABNORMAL HIGH (ref 8–23)
CO2: 25 mmol/L (ref 22–32)
Calcium: 9.4 mg/dL (ref 8.9–10.3)
Chloride: 102 mmol/L (ref 98–111)
Creatinine, Ser: 1.69 mg/dL — ABNORMAL HIGH (ref 0.61–1.24)
GFR, Estimated: 42 mL/min — ABNORMAL LOW (ref >60)
Glucose, Bld: 177 mg/dL — ABNORMAL HIGH (ref 70–99)
Potassium: 4.1 mmol/L (ref 3.5–5.1)
Sodium: 139 mmol/L (ref 135–145)
Total Bilirubin: 0.4 mg/dL (ref 0.0–1.2)
Total Protein: 6.6 g/dL (ref 6.5–8.1)

## 2024-04-16 LAB — CBC
HCT: 39 % (ref 39.0–52.0)
Hemoglobin: 12.7 g/dL — ABNORMAL LOW (ref 13.0–17.0)
MCH: 28.7 pg (ref 26.0–34.0)
MCHC: 32.6 g/dL (ref 30.0–36.0)
MCV: 88 fL (ref 80.0–100.0)
Platelets: 249 K/uL (ref 150–400)
RBC: 4.43 MIL/uL (ref 4.22–5.81)
RDW: 13.5 % (ref 11.5–15.5)
WBC: 13.9 K/uL — ABNORMAL HIGH (ref 4.0–10.5)
nRBC: 0 % (ref 0.0–0.2)

## 2024-04-16 LAB — HEMOGLOBIN A1C
Hgb A1c MFr Bld: 8.1 % — ABNORMAL HIGH (ref 4.8–5.6)
Mean Plasma Glucose: 185.77 mg/dL

## 2024-04-16 LAB — APTT: aPTT: 43 s — ABNORMAL HIGH (ref 24–36)

## 2024-04-16 MED ORDER — ASPIRIN 325 MG PO TABS
325.0000 mg | ORAL_TABLET | Freq: Every day | ORAL | Status: DC
  Filled 2024-04-16: qty 1

## 2024-04-16 MED ORDER — INSULIN ASPART 100 UNIT/ML IJ SOLN
0.0000 [IU] | Freq: Three times a day (TID) | INTRAMUSCULAR | Status: DC
  Administered 2024-04-17: 3 [IU] via SUBCUTANEOUS

## 2024-04-16 MED ORDER — BENAZEPRIL HCL 20 MG PO TABS
40.0000 mg | ORAL_TABLET | Freq: Every day | ORAL | Status: DC
Start: 2024-04-16 — End: 2024-04-17
  Administered 2024-04-16 – 2024-04-17 (×2): 40 mg via ORAL
  Filled 2024-04-16 (×2): qty 2

## 2024-04-16 MED ORDER — NITROGLYCERIN 0.4 MG SL SUBL: 0.4000 mg | SUBLINGUAL_TABLET | SUBLINGUAL | Status: DC | PRN

## 2024-04-16 MED ORDER — FONDAPARINUX SODIUM 2.5 MG/0.5ML ~~LOC~~ SOLN
2.5000 mg | SUBCUTANEOUS | Status: DC
Start: 2024-04-17 — End: 2024-04-16

## 2024-04-16 MED ORDER — MORPHINE SULFATE (PF) 2 MG/ML IV SOLN: 2.0000 mg | INTRAVENOUS | Status: DC | PRN

## 2024-04-16 MED ORDER — ACETAMINOPHEN 325 MG PO TABS: 650.0000 mg | ORAL_TABLET | ORAL | Status: DC | PRN

## 2024-04-16 MED ORDER — ISOSORBIDE MONONITRATE ER 60 MG PO TB24
120.0000 mg | ORAL_TABLET | Freq: Every day | ORAL | Status: DC
Start: 2024-04-16 — End: 2024-04-17
  Administered 2024-04-16: 120 mg via ORAL
  Filled 2024-04-16 (×2): qty 2

## 2024-04-16 MED ORDER — ONDANSETRON HCL 4 MG/2ML IJ SOLN: 4.0000 mg | Freq: Four times a day (QID) | INTRAMUSCULAR | Status: DC | PRN

## 2024-04-16 MED ORDER — ALPRAZOLAM 0.25 MG PO TABS: 0.2500 mg | ORAL_TABLET | Freq: Two times a day (BID) | ORAL | Status: DC | PRN

## 2024-04-16 MED ORDER — METOPROLOL SUCCINATE ER 50 MG PO TB24
50.0000 mg | ORAL_TABLET | Freq: Every day | ORAL | Status: DC
Start: 2024-04-17 — End: 2024-04-17
  Administered 2024-04-17: 50 mg via ORAL
  Filled 2024-04-16: qty 1

## 2024-04-16 MED ORDER — ARGATROBAN 50 MG/50ML IV SOLN
1.2000 ug/kg/min | INTRAVENOUS | Status: DC
Start: 2024-04-16 — End: 2024-04-17
  Administered 2024-04-16: 1 ug/kg/min via INTRAVENOUS
  Filled 2024-04-16: qty 50

## 2024-04-16 MED ORDER — ATORVASTATIN CALCIUM 40 MG PO TABS
80.0000 mg | ORAL_TABLET | Freq: Every day | ORAL | Status: DC
Start: 2024-04-16 — End: 2024-04-16

## 2024-04-16 MED ORDER — LIDOCAINE VISCOUS HCL 2 % MT SOLN
15.0000 mL | Freq: Once | OROMUCOSAL | Status: AC
  Administered 2024-04-16: 15 mL via ORAL
  Filled 2024-04-16: qty 15

## 2024-04-16 MED ORDER — TAMSULOSIN HCL 0.4 MG PO CAPS
0.4000 mg | ORAL_CAPSULE | Freq: Every day | ORAL | Status: DC
  Administered 2024-04-16: 0.4 mg via ORAL
  Filled 2024-04-16: qty 1

## 2024-04-16 MED ORDER — AMLODIPINE BESYLATE 5 MG PO TABS
5.0000 mg | ORAL_TABLET | Freq: Every day | ORAL | Status: DC
  Administered 2024-04-16 – 2024-04-17 (×2): 5 mg via ORAL
  Filled 2024-04-16 (×2): qty 1

## 2024-04-16 MED ORDER — ALUM & MAG HYDROXIDE-SIMETH 200-200-20 MG/5ML PO SUSP: 30.0000 mL | Freq: Four times a day (QID) | ORAL | Status: DC | PRN

## 2024-04-16 MED ORDER — PERFLUTREN LIPID MICROSPHERE
1.0000 mL | INTRAVENOUS | Status: AC | PRN
  Administered 2024-04-16: 3 mL via INTRAVENOUS

## 2024-04-16 MED ORDER — SODIUM CHLORIDE 0.9 % IV SOLN
INTRAVENOUS | Status: DC
Start: 2024-04-16 — End: 2024-04-16

## 2024-04-16 MED ORDER — SODIUM CHLORIDE 0.9 % IV SOLN: INTRAVENOUS | Status: AC

## 2024-04-16 MED ORDER — FONDAPARINUX SODIUM 2.5 MG/0.5ML ~~LOC~~ SOLN
2.5000 mg | Freq: Once | SUBCUTANEOUS | Status: DC
  Filled 2024-04-16: qty 0.5

## 2024-04-16 MED ORDER — ISOSORBIDE MONONITRATE ER 60 MG PO TB24
60.0000 mg | ORAL_TABLET | Freq: Every day | ORAL | Status: DC
  Administered 2024-04-16: 60 mg via ORAL
  Filled 2024-04-16: qty 1

## 2024-04-16 MED ORDER — ASPIRIN 81 MG PO TBEC: 81.0000 mg | DELAYED_RELEASE_TABLET | Freq: Every day | ORAL | Status: DC

## 2024-04-16 NOTE — TOC CM/SW Note (Signed)
 Transition of Care [facility]) CM/SW Note    Transition of Care [facility]) - Inpatient Brief Assessment   Patient Details  Name: Martin Hooper MRN: [id number] Date of Birth: 1951/04/11  Transition of Care [facility]) CM/SW Contact:    Noreen KATHEE Cleotilde ISRAEL Phone Number: 04/16/2024, 12:42 PM   Clinical Narrative:  Inpatient Care Manager [facility]) has reviewed patient and no ICM needs have been identified at this time. We will continue to monitor patient advancement through interdisciplinary progression rounds. If new patient transition needs arise, please place a ICM consult.  Transition of Care Asessment: Insurance and Status: Insurance coverage has been reviewed Patient has primary care physician: Yes Home environment has been reviewed: Single Family Home Prior level of function:: Independent Prior/Current Home Services: No current home services Social Drivers of Health Review: SDOH reviewed no interventions necessary Readmission risk has been reviewed: Yes Transition of care needs: no transition of care needs at this time

## 2024-04-16 NOTE — Hospital Course (Signed)
 Martin Hooper is a 73 year old male with extensive history of coronary artery disease, 5V CABG in 2013, heart failure, P A-fib recurrent left V thrombus, on Eliquis , aspirin , HTN, HLD, DM2, stage III CKD .... Presented today with chief complaint of chest pain.  Patient reports chest pain started about 8:00 this morning, substernal with no radiation was associated with diaphoresis, but no shortness of breath. Subsequently took nitroglycerin  every 5 minutes x 3, aspirin . By time he arrived to ED chest pain resolved. He also reports that his blood sugar was about 69 this morning.   ED evaluation: Blood pressure (!) 163/86, pulse 74, temperature 97.9 F (36.6 C), temperature source Oral, resp. rate 13, height 5' 8 (1.727 m), weight 90.7 kg, SpO2 94%.  LABs; potassium 4.1, glucose 177, BUN 29, creatinine 1.69 (baseline 1.5-1.6), GFR 42, BBC 13.3, Troponin 26, 38, 52, EKG a flutter rate control, HR at 86  EDP Dr. Suzette discussed the case with cardiology who recommended patient to be admitted for further evaluation possible cardiac catheterization if symptoms persist and elevated troponin

## 2024-04-16 NOTE — ED Notes (Signed)
 Pt walked ind to the bathroom.

## 2024-04-16 NOTE — H&P (Signed)
 History and Physical   Patient: Martin Hooper                            PCP: Toribio Jerel MATSU, MD                    DOB: [birth year]            DOA: 04/16/2024 FMW:[id number]             DOS: 04/16/2024, 1:33 PM  Toribio Jerel MATSU, MD  Patient coming from:   HOME  I have personally reviewed patient's medical records, in electronic medical records, including:  [facility] [facility], and care everywhere.    Chief Complaint:   Chief Complaint  Patient presents with   Chest Pain    History of present illness:    Martin Hooper is a 73 year old male with extensive history of coronary artery disease, 5V CABG in 2013, heart failure, P A-fib recurrent left V thrombus, on Eliquis , aspirin , HTN, HLD, DM2, stage III CKD .... Presented today with chief complaint of chest pain.  Patient reports chest pain started about 8:00 this morning, substernal with no radiation was associated with diaphoresis, but no shortness of breath. Subsequently took nitroglycerin  every 5 minutes x 3, aspirin . By time he arrived to ED chest pain resolved. He also reports that his blood sugar was about 69 this morning.   ED evaluation: Blood pressure (!) 163/86, pulse 74, temperature 97.9 F (36.6 C), temperature source Oral, resp. rate 13, height 5' 8 (1.727 m), weight 90.7 kg, SpO2 94%.  LABs; potassium 4.1, glucose 177, BUN 29, creatinine 1.69 (baseline 1.5-1.6), GFR 42, BBC 13.3, Troponin 26, 38, 52, EKG a flutter rate control, HR at 86  EDP Dr. Suzette discussed the case with cardiology who recommended patient to be admitted for further evaluation possible cardiac catheterization if symptoms persist and elevated troponin    Patient Denies having: Fever, Chills, Cough, SOB, Abd pain, N/V/D, headache, dizziness, lightheadedness,  Dysuria, Joint pain, rash, open wounds   Review of Systems: As per HPI, otherwise 10 point review of systems were negative.    ----------------------------------------------------------------------------------------------------------------------  Allergies  Allergen Reactions   Alpha-Gal Anaphylaxis    Any mammal meat    Beef (Bovine) Protein Anaphylaxis   Pork-Derived Products Anaphylaxis    Home MEDs:  Prior to Admission medications   Medication Sig Start Date End Date Taking? Authorizing Provider  amLODipine  (NORVASC ) 5 MG tablet Take 1 tablet by mouth once daily 10/25/23  Yes Mallipeddi, Vishnu P, MD  aspirin  325 MG tablet Take 325 mg by mouth daily.   Yes [provider]  atorvastatin  (LIPITOR) 80 MG tablet Take 80 mg by mouth daily.   Yes [provider]  benazepril  (LOTENSIN ) 40 MG tablet Take 40 mg by mouth daily.   Yes [provider]  isosorbide  mononitrate (IMDUR ) 120 MG 24 hr tablet Take 120 mg by mouth daily.   Yes [provider]  metoprolol  succinate (TOPROL -XL) 50 MG 24 hr tablet Take 1 tablet (50 mg total) by mouth daily. Take with or immediately following a meal. 11/19/23 04/16/24 Yes Miriam Norris, NP  nitroGLYCERIN  (NITROSTAT ) 0.4 MG SL tablet Place 1 tablet (0.4 mg total) under the tongue every 5 (five) minutes as needed for chest pain. 11/15/23 04/16/24 Yes Mallipeddi, Vishnu P, MD  tamsulosin (FLOMAX) 0.4 MG CAPS capsule Take 0.4 mg by mouth in the morning and at bedtime.  Yes [provider]    PRN MEDs: acetaminophen , ALPRAZolam, alum & mag hydroxide-simeth **AND** [COMPLETED] lidocaine , morphine  injection, nitroGLYCERIN , ondansetron  (ZOFRAN ) IV  Past Medical History:  Diagnosis Date   Allergy to alpha-gal    CAD (coronary artery disease)    reports CAD stents in 2000, 5v CABG around 2013.   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 He has never used smokeless tobacco. He reports that he does not drink alcohol and does not use drugs.   Family History  Problem Relation Age of Onset   Diabetes Mother    Schizophrenia Mother    Cancer Father     Physical Exam:   Vitals:   04/16/24 1230 04/16/24 1245 04/16/24 1300 04/16/24 1320  BP: (!) 169/86 (!) 180/83 (!) 163/86   Pulse: 63 65 74   Resp: 15 13 13    Temp:    97.9 F (36.6 C)  TempSrc:    Oral  SpO2: 96% 95% 94%   Weight:      Height:       Constitutional: NAD, calm, comfortable Eyes: PERRL, lids and conjunctivae normal ENMT: Mucous membranes are moist. Posterior pharynx clear of any exudate or lesions.Normal dentition.  Neck: normal, supple, no masses, no thyromegaly Respiratory: clear to auscultation bilaterally, no wheezing, no crackles. Normal respiratory effort. No accessory muscle use.  Cardiovascular: Regular rate and rhythm, no murmurs / rubs / gallops. No extremity edema. 2+ pedal pulses. No carotid bruits.  Abdomen: no tenderness, no masses palpated. No hepatosplenomegaly. Bowel sounds positive.  Musculoskeletal: no clubbing / cyanosis. No joint deformity upper and lower extremities. Good ROM, no contractures. Normal muscle tone.  Neurologic: CN II-XII grossly intact. Sensation intact, DTR normal. Strength 5/5 in all 4.  Psychiatric: Normal judgment and insight. Alert and oriented x 3. Normal mood.  Skin: no rashes, lesions, ulcers. No induration          Labs on admission:    I have personally reviewed following labs and imaging studies  CBC: Recent Labs  Lab 04/16/24 0809  WBC 13.3*  NEUTROABS 10.5*   HGB 13.2  HCT 40.2  MCV 87.6  PLT 261   Basic Metabolic Panel: Recent Labs  Lab 04/16/24 0809  NA 139  K 4.1  CL 102  CO2 25  GLUCOSE 177*  BUN 29*  CREATININE 1.69*  CALCIUM  9.4   GFR: Estimated Creatinine Clearance: 42.6 mL/min (A) (by C-G formula based on SCr of 1.69 mg/dL (H)). Liver Function Tests: Recent Labs  Lab 04/16/24 0809  AST 24  ALT 19  ALKPHOS 68  BILITOT 0.4  PROT 6.6  ALBUMIN 4.0     Last A1C:  Lab Results  Component Value Date   HGBA1C 8.8 (H) 08/20/2022     Radiologic Exams on Admission:   DG Chest [facility] 1 View Result  Date: 04/16/2024 CLINICAL DATA:  pain EXAM: PORTABLE CHEST - 1 VIEW COMPARISON:  04/29/2023 FINDINGS: No focal airspace consolidation, pleural effusion, or pneumothorax. Mild cardiomegaly. Sternotomy wires and CABG changes. Tortuous aorta with aortic atherosclerosis. No acute fracture or destructive lesions. Multilevel thoracic osteophytosis. IMPRESSION: No acute cardiopulmonary abnormality. Electronically Signed   By: Rogelia Myers M.D.   On: 04/16/2024 08:33    EKG:   Independently reviewed.  Orders placed or performed during the hospital encounter of 04/16/24   EKG 12-Lead   EKG 12-Lead   EKG 12-Lead   EKG 12-Lead   EKG 12-Lead   EKG 12-Lead   EKG 12-Lead (at 6am)   ---------------------------------------------------------------------------------------------------------------------------------------    Assessment / Plan:   Principal Problem:   Chest pain Active Problems:   Coronary artery disease involving coronary bypass graft with angina pectoris   HTN, goal below 130/80   HLD (hyperlipidemia)   LV (left ventricular) mural thrombus   Diabetes (HCC)   Stage 3a chronic kidney disease (CKD) (HCC)   Atrial fibrillation, chronic (HCC)   Assessment and Plan: * Chest pain Ruling out myocardial infarction -Admit the patient to telemetry floor for close observation -Repeating EKG as needed -Recycling  cardiac enzymes: 26 >> 38 >>52 -Patient is on aspirin , Eliquis  and high-dose statins at home -Will continue with aspirin , high-dose statins, holding Eliquis , initiating therapeutic Lovenox (intolerance of heparin ) In anticipation of possible cardiac catheterization   - As needed nitroglycerin , morphine  -Keep the patient n.p.o. - Extensive history of coronary artery disease, CABG, with stent,   Coronary artery disease involving coronary bypass graft with angina pectoris Extensive history of coronary artery disease, with CABG, and stent -Doing home medication, continuing aspirin , high-dose statins Continue aggressive medication per cardiology recommendation including Imdur  and beta-blockers Holding Eliquis , will be on Lovenox therapeutic dose -Checking lipid panel,  Atrial fibrillation, chronic (HCC) - Rate controlled with Toprol -XL 50 mg daily, and Eliquis  5 mg p.o. twice daily -Continue Toprol -XL, holding Eliquis  for now  Will be on Lovenox therapeutic dose Pending possible cardiac cath  Stage 3a chronic kidney disease (CKD) (HCC) - CKD stage IIIa Baseline creatinine 1.5-1.6 Current creatinine 1.69 Lab Results  Component Value Date   CREATININE 1.69 (H) 04/16/2024   CREATININE 1.64 (H) 08/22/2022   CREATININE 1.52 (H) 08/21/2022   Avoiding nephrotoxins, hypotension,  Diabetes (HCC) - Holding home regimen -N.p.o. -Will check CBG every 4 hours, SSI coverage -Checking A1c Last A1c 9.1 on 11/2023  LV (left ventricular) mural thrombus Chronic recurrent left ventricular mural thrombus chronically anticoagulated on Eliquis  Currently on hold will be on Lovenox therapeutic Obtaining echocardiogram  HLD (hyperlipidemia) Continuing high intensity statins, currently on Lipitor 80 mg daily Last LDL at 46 on 5/25 Checking lipid panel  HTN, goal below 130/80 - Elevated BP today -not take his medication aside from Toprol -XL -Reviewing home medication, will continue Toprol -XL,  Imdur , Benzepril, , hold HCTZ, -As needed IV hydralazine       Consults called: Cardiology -------------------------------------------------------------------------------------------------------------------------------------------- DVT prophylaxis:  SCDs Start: 04/16/24 1225   Code Status:   Code Status: Full Code   Admission status: Patient will be admitted as Observation, with a greater than 2 midnight length of stay. Level of care: Telemetry   Family Communication:  none at bedside  (The above findings and plan of care has been discussed with patient in detail, the patient expressed understanding and agreement of above plan)  --------------------------------------------------------------------------------------------------------------------------------------------------  Disposition Plan:  Anticipated 1-2 days Status is: Observation The patient remains OBS appropriate and  will d/c before 2 midnights.     ----------------------------------------------------------------------------------------------------------------------------------------------------  Time spent:  28  Min.  Was spent seeing and evaluating the patient, reviewing all medical records, drawn plan of care.  SIGNED: Adriana DELENA Grams, MD, FHM. FAAFP. [facility], Pager  (Please use [email or web address] to page/ or secure chat through epic) If 7PM-7AM, please contact night-coverage [email or web address],  04/16/2024, 1:33 PM

## 2024-04-16 NOTE — Assessment & Plan Note (Signed)
-   CKD stage IIIa Baseline creatinine 1.5-1.6 Current creatinine 1.69 Lab Results  Component Value Date   CREATININE 1.69 (H) 04/16/2024   CREATININE 1.64 (H) 08/22/2022   CREATININE 1.52 (H) 08/21/2022   Avoiding nephrotoxins, hypotension,

## 2024-04-16 NOTE — Plan of Care (Signed)
  Problem: Activity: Goal: Ability to tolerate increased activity will improve Outcome: Progressing   Problem: Cardiac: Goal: Ability to achieve and maintain adequate cardiovascular perfusion will improve Outcome: Progressing   Problem: Metabolic: Goal: Ability to maintain appropriate glucose levels will improve Outcome: Progressing   Problem: Nutritional: Goal: Maintenance of adequate nutrition will improve Outcome: Progressing

## 2024-04-16 NOTE — Assessment & Plan Note (Signed)
-   Holding home regimen -N.p.o. -Will check CBG every 4 hours, SSI coverage -Checking A1c Last A1c 9.1 on 11/2023

## 2024-04-16 NOTE — ED Triage Notes (Signed)
 Pt c/o chest pain; hx of heart attacks. Last heart attack, feb 2024. Pt states he had low blood sugar this morning, measured at 62. Pt states he ate and measured blood glucose at 142 after eating. Denies N/V/D, headache. A&Ox4.

## 2024-04-16 NOTE — Assessment & Plan Note (Signed)
 Extensive history of coronary artery disease, with CABG, and stent -Doing home medication, continuing aspirin , high-dose statins Continue aggressive medication per cardiology recommendation including Imdur  and beta-blockers Holding Eliquis , will be on Lovenox therapeutic dose -Checking lipid panel,

## 2024-04-16 NOTE — Progress Notes (Addendum)
 PHARMACY - ANTICOAGULATION CONSULT NOTE  Pharmacy Consult for argatroban  Indication: chest pain/ACS/afib/LV mural thrombus  Allergies  Allergen Reactions   Alpha-Gal Anaphylaxis    Any mammal meat    Beef (Bovine) Protein Anaphylaxis   Pork-Derived Products Anaphylaxis    Patient Measurements: Height: 5' 8 (172.7 cm) Weight: 90.7 kg (200 lb) IBW/kg (Calculated) : 68.4 HEPARIN  DW (KG): 87.1  Vital Signs: Temp: 97.9 F (36.6 C) (10/02 1320) Temp Source: Oral (10/02 1320) BP: 163/86 (10/02 1300) Pulse Rate: 74 (10/02 1300)  Labs: Recent Labs    04/16/24 0809  HGB 13.2  HCT 40.2  PLT 261  CREATININE 1.69*    Estimated Creatinine Clearance: 42.6 mL/min (A) (by C-G formula based on SCr of 1.69 mg/dL (H)).   Medical History: Past Medical History:  Diagnosis Date   Allergy to alpha-gal    CAD (coronary artery disease)    reports CAD stents in 2000, 5v CABG around 2013.   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   04/15/2024 at 10:00 PM   aspirin  325 MG tablet Take 325 mg by mouth daily.   04/16/2024 Morning   atorvastatin  (LIPITOR) 80 MG tablet Take 80 mg by mouth daily.   04/15/2024 Evening   benazepril  (LOTENSIN ) 40 MG tablet Take 40 mg by mouth daily.   04/15/2024 Morning   COD LIVER OIL PO Take 1 tablet by mouth daily.   04/15/2024 Morning   Coenzyme Q10 (CO Q-10) 100 MG CAPS Take 1 capsule by mouth daily.   04/15/2024 Morning   esomeprazole (NEXIUM) 20 MG packet Take 20 mg by mouth daily before breakfast.   04/15/2024 Morning    hydrochlorothiazide  (HYDRODIURIL ) 25 MG tablet Take 25 mg by mouth daily.   04/15/2024 Morning   insulin  degludec (TRESIBA) 200 UNIT/ML FlexTouch Pen Inject 64-66 Units into the skin at bedtime.   04/15/2024 Bedtime   isosorbide  mononitrate (IMDUR ) 120 MG 24 hr tablet Take 120 mg by mouth daily.   04/15/2024 Morning   isosorbide  mononitrate (IMDUR ) 30 MG 24 hr tablet Take 30 mg by mouth daily as needed (take every evening as needed in addition to 120 mg daily.).   Unknown   MAGNESIUM GLYCINATE PO Take 210 mg by mouth 2 (two) times daily.   04/15/2024 Evening   metFORMIN (GLUCOPHAGE) 500 MG tablet Take 500-1,000 mg by mouth 2 (two) times daily with a meal. Take 2 tablet (1000 mg) in the morning and 1 tablets (500 mg) every evening.   04/15/2024 Evening   metoprolol  succinate (TOPROL -XL) 50 MG 24 hr tablet Take 1 tablet (50 mg total) by mouth daily. Take with or immediately following a meal. 90 tablet 1 04/16/2024 Morning   nitroGLYCERIN  (NITROSTAT ) 0.4 MG SL tablet Place 1 tablet (0.4 mg total) under the tongue every 5 (five) minutes as needed for chest pain. 25 tablet 3 04/16/2024 Morning   tamsulosin (FLOMAX) 0.4 MG CAPS capsule Take 0.4 mg by mouth in the morning and at bedtime.  04/15/2024 Bedtime   VITAMIN D-VITAMIN K PO Take by mouth.   04/15/2024 Morning    Assessment: Patient on Eliquis  prior to admission, last dose 10/1 @ 2200.  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 Holding PTA Eliquis  due to possible procedure. He also has left ventricular mural thrombus along with afib.  Goal of Therapy:  Goal aPTT 50-90 Monitor platelets by anticoagulation protocol: Yes   Plan:  Argatroban 1 mcg/kg/min Monitor H&H and platelets   Elspeth Sour, PharmD Clinical Pharmacist 04/16/2024 3:25 PM

## 2024-04-16 NOTE — Progress Notes (Signed)
*  PRELIMINARY RESULTS* Echocardiogram 2D Echocardiogram has been performed with Definity .  Martin Hooper 04/16/2024, 4:21 PM

## 2024-04-16 NOTE — Assessment & Plan Note (Signed)
 Continuing high intensity statins, currently on Lipitor 80 mg daily Last LDL at 46 on 5/25 Checking lipid panel

## 2024-04-16 NOTE — Assessment & Plan Note (Signed)
 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

## 2024-04-16 NOTE — ED Provider Notes (Signed)
 [place] EMERGENCY DEPARTMENT AT [facility] Provider Note   CSN: [id number] Arrival date & time: 04/16/24  0740     Patient presents with: Chest Pain   Martin Hooper is a 73 y.o. male.   Patient with a history of coronary artery disease and hyperlipidemia.  He had significant chest pain today and took 3 nitros along with an aspirin .  Patient feels better now  The history is provided by the patient and medical records. No language interpreter was used.  Chest Pain Pain location:  L chest Pain quality: aching   Pain radiates to:  Does not radiate Pain severity:  Moderate Onset quality:  Sudden Timing:  Constant Progression:  Worsening Chronicity:  New Context: not breathing   Relieved by:  Nothing Worsened by:  Nothing Associated symptoms: no abdominal pain, no back pain, no cough, no fatigue and no headache        Prior to Admission medications   Medication Sig Start Date End Date Taking? Authorizing Provider  amLODipine  (NORVASC ) 5 MG tablet Take 1 tablet by mouth once daily 10/25/23  Yes Mallipeddi, Vishnu P, MD  apixaban  (ELIQUIS ) 5 MG TABS tablet Take 1 tablet by mouth twice daily 10/14/23  Yes Branch, Dorn FALCON, MD  Ascorbic Acid (VITAMIN C) 1000 MG tablet Take 1,000 mg by mouth daily.   Yes [provider]  aspirin  325 MG tablet Take 325 mg by mouth daily.   Yes [provider]  atorvastatin  (LIPITOR) 80 MG tablet Take 80 mg by mouth daily.   Yes [provider]  benazepril  (LOTENSIN ) 40 MG tablet Take 40 mg by mouth daily.   Yes [provider]  COD LIVER OIL PO Take 1 tablet by mouth daily.   Yes [provider]  Coenzyme Q10 100 MG TABS Take 100 mg by mouth daily.   Yes [provider]  esomeprazole (NEXIUM) 20 MG capsule Take 20 mg by mouth daily at 12 noon.   Yes [provider]  hydrochlorothiazide  (HYDRODIURIL ) 25 MG tablet Take 25 mg by mouth daily.   Yes [provider]   isosorbide  mononitrate (IMDUR ) 120 MG 24 hr tablet Take 120 mg by mouth daily.   Yes [provider]  MAGNESIUM GLYCINATE PO Take 210 mg by mouth 2 (two) times daily.   Yes [provider]  metFORMIN (GLUCOPHAGE-XR) 500 MG 24 hr tablet Take 500-1,000 mg by mouth See admin instructions. Take 1000mg  in the AM and 500mg  in the PM.   Yes [provider]  metoprolol  succinate (TOPROL -XL) 50 MG 24 hr tablet Take 1 tablet (50 mg total) by mouth daily. Take with or immediately following a meal. 11/19/23 04/16/24 Yes Miriam Norris, NP  nitroGLYCERIN  (NITROSTAT ) 0.4 MG SL tablet Place 1 tablet (0.4 mg total) under the tongue every 5 (five) minutes as needed for chest pain. 11/15/23 04/16/24 Yes Mallipeddi, Vishnu P, MD  tamsulosin (FLOMAX) 0.4 MG CAPS capsule Take 0.4 mg by mouth in the morning and at bedtime.   Yes [provider]  TRESIBA FLEXTOUCH 200 UNIT/ML FlexTouch Pen Inject 64-66 Units into the skin at bedtime.   Yes [provider]  Turmeric 500 MG CAPS Take 1 capsule by mouth daily.   Yes [provider]  Vitamin D-Vitamin K (VITAMIN K2-VITAMIN D3 PO) Take 1 tablet by mouth daily.   Yes [provider]  ACCU-CHEK GUIDE test strip USE TO TEST BLOOD SUGAR 4 TIMES A DAY 03/19/23   [provider]  BD PEN NEEDLE NANO 2ND GEN 32G X 4 MM MISC USE 1 TWICE DAILY TO GIVE INSULIN  04/14/23   [provider]  isosorbide  mononitrate (IMDUR ) 30 MG 24 hr tablet Take 1 tablet (30 mg total) by mouth daily with supper. Patient not taking: Reported on 04/16/2024 11/19/23   Miriam Norris, NP  Zinc 30 MG CAPS Take 30 mg by mouth daily.    [provider]    Allergies: Alpha-gal, Beef (bovine) protein, and Pork-derived products    Review of Systems  Constitutional:  Negative for appetite change and fatigue.  HENT:  Negative for congestion, ear discharge and sinus pressure.   Eyes:  Negative for discharge.  Respiratory:  Negative for  cough.   Cardiovascular:  Positive for chest pain.  Gastrointestinal:  Negative for abdominal pain and diarrhea.  Genitourinary:  Negative for frequency and hematuria.  Musculoskeletal:  Negative for back pain.  Skin:  Negative for rash.  Neurological:  Negative for seizures and headaches.  Psychiatric/Behavioral:  Negative for hallucinations.     Updated Vital Signs BP (!) 157/81   Pulse 64   Temp 97.8 F (36.6 C) (Oral)   Resp 18   Ht 5' 8 (1.727 m)   Wt 90.7 kg   SpO2 96%   BMI 30.41 kg/m   Physical Exam Vitals and nursing note reviewed.  Constitutional:      Appearance: Normal appearance. He is well-developed.  HENT:     Head: Normocephalic.     Nose: Nose normal.  Eyes:     General: No scleral icterus.    Conjunctiva/sclera: Conjunctivae normal.  Neck:     Thyroid: No thyromegaly.  Cardiovascular:     Rate and Rhythm: Normal rate and regular rhythm.     Heart sounds: No murmur heard.    No friction rub. No gallop.  Pulmonary:     Breath sounds: No stridor. No wheezing or rales.  Chest:     Chest wall: No tenderness.  Abdominal:     General: There is no distension.     Tenderness: There is no abdominal tenderness. There is no rebound.  Musculoskeletal:        General: Normal range of motion.     Cervical back: Neck supple.  Lymphadenopathy:     Cervical: No cervical adenopathy.  Skin:    Findings: No erythema or rash.  Neurological:     Mental Status: He is alert and oriented to person, place, and time.     Motor: No abnormal muscle tone.     Coordination: Coordination normal.  Psychiatric:        Behavior: Behavior normal.     (all labs ordered are listed, but only abnormal results are displayed) Labs Reviewed  CBC WITH DIFFERENTIAL/PLATELET - Abnormal; Notable for the following components:      Result Value   WBC 13.3 (*)    Neutro Abs 10.5 (*)    All other components within normal limits  COMPREHENSIVE METABOLIC PANEL WITH GFR - Abnormal;  Notable for the following components:   Glucose, Bld 177 (*)    BUN 29 (*)    Creatinine, Ser 1.69 (*)    GFR, Estimated 42 (*)    All other components within normal limits  TROPONIN T, HIGH SENSITIVITY - Abnormal; Notable for the following components:   Troponin T High Sensitivity 26 (*)    All other components within normal limits  TROPONIN T, HIGH SENSITIVITY - Abnormal; Notable for the following  components:   Troponin T High Sensitivity 38 (*)    All other components within normal limits  TROPONIN T, HIGH SENSITIVITY - Abnormal; Notable for the following components:   Troponin T High Sensitivity 52 (*)    All other components within normal limits    EKG: EKG Interpretation Date/Time:  Thursday April 16 2024 07:47:53 EDT Ventricular Rate:  86 PR Interval:    QRS Duration:  98 QT Interval:  386 QTC Calculation: 461 R Axis:   -50  Text Interpretation: Atrial flutter with 3:1 A-V conduction Left anterior fascicular block Nonspecific ST and T wave abnormality Prolonged QT Abnormal ECG When compared with ECG of 19-Nov-2023 11:33, Significant changes have occurred Confirmed by Suzette Pac [phone]) on 04/16/2024 7:55:02 AM  Radiology: [facility] Chest Port 1 View Result Date: 04/16/2024 CLINICAL DATA:  pain EXAM: PORTABLE CHEST - 1 VIEW COMPARISON:  04/29/2023 FINDINGS: No focal airspace consolidation, pleural effusion, or pneumothorax. Mild cardiomegaly. Sternotomy wires and CABG changes. Tortuous aorta with aortic atherosclerosis. No acute fracture or destructive lesions. Multilevel thoracic osteophytosis. IMPRESSION: No acute cardiopulmonary abnormality. Electronically Signed   By: Rogelia Myers M.D.   On: 04/16/2024 08:33     Procedures   Medications Ordered in the ED  amLODipine  (NORVASC ) tablet 5 mg (has no administration in time range)  atorvastatin  (LIPITOR) tablet 80 mg (has no administration in time range)  benazepril  (LOTENSIN ) tablet 40 mg (has no administration in time  range)  metoprolol  succinate (TOPROL -XL) 24 hr tablet 50 mg (has no administration in time range)   Patient was evaluated by cardiology and they recommend admission to medicine with serial troponins and the patient may need catheterization to more                                 Medical Decision Making Amount and/or Complexity of Data Reviewed Labs: ordered. Radiology: ordered. ECG/medicine tests: ordered.  Risk Decision regarding hospitalization.   Chest pain with elevated troponin consistent with coronary artery disease.  Patient admitted to medicine with cardiology following     Final diagnoses:  Nonspecific chest pain    ED Discharge Orders     None          Suzette Pac, MD 04/20/24 1609

## 2024-04-16 NOTE — Assessment & Plan Note (Addendum)
 Ruling out myocardial infarction -Admit the patient to telemetry floor for close observation -Repeating EKG as needed -Recycling cardiac enzymes: 26 >> 38 >>52 -Patient is on aspirin , Eliquis  and high-dose statins at home -Will continue with aspirin , high-dose statins, holding Eliquis , initiating therapeutic Lovenox (intolerance of heparin ) In anticipation of possible cardiac catheterization   - As needed nitroglycerin , morphine  -Keep the patient n.p.o. - Extensive history of coronary artery disease, CABG, with stent,

## 2024-04-16 NOTE — Assessment & Plan Note (Addendum)
-   Elevated BP today -not take his medication aside from Toprol -XL -Reviewing home medication, will continue Toprol -XL, Imdur , Benzepril, , hold HCTZ, -As needed IV hydralazine

## 2024-04-16 NOTE — Consult Note (Signed)
 Cardiology Consultation   Patient ID: Martin Hooper MRN: [id number]; DOB: [birth year]  Admit date: 04/16/2024 Date of Consult: 04/16/2024  PCP:  Toribio Jerel MATSU, MD   [facility] Providers Cardiologist:  Alvan Carrier, MD        Patient Profile: Martin Hooper is a 73 y.o. male with a hx of CAD (s/p PCI in 2000, 5V CABG in 2013 with LIMA-LAD, seq SVG-OM2-OM3, SVG-D1, and SVG-PDA, cath in 08/2022 showing 4/5 patent grafts and no targets for PCI), chronic HFimpEF, paroxysmal atrial fibrillation (9% burden by monitor in 11/2023),  HTN, HLD, Type 2 DM, Stage 3 CKD and recurrent LV thrombus who is being seen 04/16/2024 for the evaluation of chest pain at the request of Dr. Suzette.  History of Present Illness:  Martin Hooper was last examined by Almarie Crate, NP in 12/2023 and had previously been experiencing palpitations but this had improved with dose adjustment of Toprol -XL.  No changes are made to his cardiac medications and he was continued on Amlodipine  5 mg daily, Eliquis  5 mg twice daily, Atorvastatin  80 mg daily, Benazepril  40 mg daily, HCTZ 25 mg daily, Imdur  120 mg in AM/30 mg in PM and Toprol -XL 50 mg daily.  He presented to [facility] ED this morning for evaluation of chest pain. Reported having hypoglycemia with blood sugar at 62 initially. Initial labs thus far show WBC 13.3, Hgb 13.2, platelets 261, Na+ 139, K+ 4.1 and creatinine 1.69 (close to baseline of 1.5 - 1.6). Initial and repeat Hs Troponin values at 26 and 38. CXR with no acute cardiopulmonary abnormalities. EKG shows rate-controlled atrial flutter, HR 86 with LAFB.   In talking with the patient and his wife today, he reports having stable angina over the past year which typically occurs if he overdoes it and symptoms resolve with SL NTG x 1. This past Tuesday, he developed chest pain while walking around his home and also at times at rest. Did take 3 SL NTG  throughout the day. Says that he felt back to  baseline yesterday with no recurrent pain. This morning, at approximately 0500 he developed chest pain and was also sweaty at that time and noted his blood sugar was in the 60s which is low for him  He consumed a snack and blood sugar improved but he continued to have right sided chest discomfort. Denies any associated nausea or vomiting. Took nitroglycerin  x 3 along with ASA but pain persisted, therefore he came to the ED for further evaluation. Pain lasted for a total of almost 3 hours. He reports the pain was mostly right sided but did radiate to his left pectoral region at times.  No recent dyspnea on exertion, orthopnea, PND or pitting edema.  Reports compliance with his medications and his last dose of Eliquis  was in the evening on 04/15/2024. Says he only takes Imdur  120mg  in the AM and does not take a PM dose.   Past Medical History:  Diagnosis Date   Allergy to alpha-gal    CAD (coronary artery disease)    reports CAD stents in 2000, 5v CABG around 2013.   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 Authorizing Provider  amLODipine  (NORVASC ) 5 MG tablet Take 1 tablet by mouth once daily 10/25/23  Yes Mallipeddi, Vishnu P, MD  apixaban  (ELIQUIS ) 5 MG TABS tablet Take 1 tablet by mouth twice daily 10/14/23  Yes Branch, Dorn FALCON, MD  Ascorbic Acid (VITAMIN C) 1000 MG tablet Take 1,000 mg by mouth  daily.   Yes [provider]  aspirin  325 MG tablet Take 325 mg by mouth daily.   Yes [provider]  atorvastatin  (LIPITOR) 80 MG tablet Take 80 mg by mouth daily.   Yes [provider]  benazepril  (LOTENSIN ) 40 MG tablet Take 40 mg by mouth daily.   Yes [provider]  COD LIVER OIL PO Take 1 tablet by mouth daily.   Yes [provider]  Coenzyme Q10 100 MG TABS Take 100 mg by mouth daily.   Yes [provider]  esomeprazole (NEXIUM) 20 MG capsule Take 20 mg by mouth daily at 12 noon.   Yes [provider]  hydrochlorothiazide  (HYDRODIURIL ) 25 MG tablet Take 25 mg by mouth daily.   Yes [provider]  isosorbide  mononitrate (IMDUR ) 120 MG 24 hr tablet Take 120 mg by mouth daily.   Yes [provider]  MAGNESIUM GLYCINATE PO Take 210 mg by mouth 2 (two) times daily.   Yes [provider]  metFORMIN (GLUCOPHAGE-XR) 500 MG 24 hr tablet Take 500-1,000 mg by mouth See admin instructions. Take 1000mg  in the AM and 500mg  in the PM.   Yes [provider]  metoprolol  succinate (TOPROL -XL) 50 MG 24 hr tablet Take 1 tablet (50 mg total) by mouth daily. Take with or immediately following a meal. 11/19/23 04/16/24 Yes Miriam Norris, NP  nitroGLYCERIN  (NITROSTAT ) 0.4 MG SL tablet Place 1 tablet (0.4 mg total) under the tongue every 5 (five) minutes as needed for chest pain. 11/15/23 04/16/24 Yes Mallipeddi, Vishnu P, MD  tamsulosin (FLOMAX) 0.4 MG CAPS capsule Take 0.4 mg by mouth in the morning and at bedtime.   Yes [provider]  TRESIBA FLEXTOUCH 200 UNIT/ML FlexTouch Pen Inject 64-66 Units into the skin at bedtime.   Yes [provider]  Turmeric 500 MG CAPS Take 1 capsule by mouth daily.   Yes [provider]  Vitamin D-Vitamin K (VITAMIN K2-VITAMIN D3 PO) Take 1 tablet by mouth daily.   Yes [provider]  ACCU-CHEK GUIDE test strip USE TO TEST BLOOD SUGAR 4 TIMES A DAY  03/19/23   [provider]  BD PEN NEEDLE NANO 2ND GEN 32G X 4 MM MISC USE 1 TWICE DAILY TO GIVE INSULIN  04/14/23   [provider]  isosorbide  mononitrate (IMDUR ) 30 MG 24 hr tablet Take 1 tablet (30 mg total) by mouth daily with supper. Patient not taking: Reported on 04/16/2024 11/19/23   Miriam Norris, NP  Zinc 30 MG CAPS Take 30 mg by mouth daily.    [provider]    Scheduled Meds:  Continuous Infusions:  PRN Meds:   Allergies:    Allergies  Allergen Reactions   Alpha-Gal Anaphylaxis    Any mammal meat  Beef (Bovine) Protein Anaphylaxis   Pork-Derived Products Anaphylaxis    Social History:   Social History   Socioeconomic History   Marital status: Married    Spouse name: Not on file   Number of children: Not on file   Years of education: Not on file   Highest education level: Not on file  Occupational History   Not on file  Tobacco Use   Smoking status: Never   Smokeless tobacco: Never  Vaping Use   Vaping status: Never Used  Substance and Sexual Activity   Alcohol use: Never   Drug use: Never   Sexual activity: Not on file  Other Topics Concern   Not on file  Social History Narrative   Not on file   Social Drivers of Health   Financial Resource Strain: Not on file  Food Insecurity: No Food Insecurity (08/20/2022)   Hunger Vital Sign    Worried About Running Out of Food in the Last Year: Never true    Ran Out of Food in the Last Year: Never true  Transportation Needs: No Transportation Needs (08/20/2022)   PRAPARE - Administrator, Civil Service (Medical): No    Lack of Transportation (Non-Medical): No  Physical Activity: Not on file  Stress: Not on file  Social Connections: Unknown (11/16/2021)   Received from [facility]   Social Network    Social Network: Not on file  Intimate Partner Violence: Not At Risk (08/20/2022)   Humiliation, Afraid, Rape, and Kick questionnaire    Fear of Current or Ex-Partner: No     Emotionally Abused: No    Physically Abused: No    Sexually Abused: No    Family History:    Family History  Problem Relation Age of Onset   Diabetes Mother    Schizophrenia Mother    Cancer Father      ROS:  Please see the history of present illness.   All other ROS reviewed and negative.     Physical Exam/Data: Vitals:   04/16/24 0915 04/16/24 0930 04/16/24 0945 04/16/24 1000  BP: (!) 152/85 (!) 161/84 (!) 159/92 (!) 157/81  Pulse: 74 68 69 64  Resp: 18 17 12 13   Temp:      TempSrc:      SpO2: 98% 94% 98% 97%  Weight:      Height:       No intake or output data in the 24 hours ending 04/16/24 1105    04/16/2024    7:47 AM 12/31/2023    2:31 PM 11/19/2023   10:46 AM  Last 3 Weights  Weight (lbs) 200 lb 209 lb 12.8 oz 209 lb 12.8 oz  Weight (kg) 90.719 kg 95.165 kg 95.165 kg     Body mass index is 30.41 kg/m.  General:  Well nourished, well developed male appearing in no acute distress HEENT: normal Neck: no JVD Vascular: No carotid bruits; Distal pulses 2+ bilaterally Cardiac:  normal S1, S2; Irregularly irregular; no murmur  Lungs:  clear to auscultation bilaterally, no wheezing, rhonchi or rales  Abd: soft, nontender, no hepatomegaly  Ext: no pitting edema Musculoskeletal:  No deformities, BUE and BLE strength normal and equal Skin: warm and dry  Neuro:  CNs 2-12 intact, no focal abnormalities noted Psych:  Normal affect   EKG:  The EKG was personally reviewed and demonstrates: Rate-controlled atrial flutter, HR 86 with LAFB.  Telemetry:  Telemetry was personally reviewed and demonstrates: Atrial flutter, HR in 70's to  80's.   Relevant CV Studies:   Cardiac Catheterization: 08/2022   Prox RCA lesion is 50% stenosed.   Mid RCA lesion is 100% stenosed.   2nd Diag lesion is 100% stenosed.   Mid Cx lesion is 100% stenosed.   Mid Graft to Insertion lesion between 2nd Mrg and 3rd Mrg  is 99% stenosed.   Mid LAD lesion is 100% stenosed.   SVG graft was  visualized by angiography.   SVG graft was visualized by angiography.   LIMA graft was visualized by non-selective angiography.   Severe three vessel CAD s/p 5V CABG with 4/5 patent bypass grafts Chronic occlusion mid LAD. The mid and distal LAD fills from the patent LIMA graft.The Diagonal branch fills from the patent vein graft The Circumflex is totally occluded in the mid segment. The sequential vein graft to the second and third OM branches is patent to the second OM branch but the distal limb of this graft to the small third OM branch is occluded. There is collateral filling of this third OM branch from collaterals supplied through the SVG to Diagonal.  The large dominant RCA is occluded in the mid segment. Patent vein graft to the distal RCA   Recommendations: No focal targets for PCI. I suspect that his recent change in symptoms is related to the occlusion of the distal limb of the sequential vein graft to the most distal OM branch. This small branch fills from left to left collaterals. Continue medical management of CAD.   Limited Echo: 01/2023 IMPRESSIONS     1. Limited study with Definity  contrast.   2. Very small, intermittently seen apical LV thrombus noted with  surrounding slow flow and thrombotic material. 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   3. Left ventricular ejection fraction, by estimation, is 55 to 60%. The  left ventricle has normal function. The left ventricle demonstrates  regional wall motion abnormalities (see scoring diagram/findings for  description). There is mild concentric left  ventricular hypertrophy.   4. Right ventricular systolic function is normal. The right ventricular  size is normal.   5. The mitral valve is grossly normal. Trivial mitral valve  regurgitation.   6. The aortic valve is tricuspid. Aortic valve regurgitation is not  visualized.   7. Aortic dilatation noted. There is mild dilatation of the aortic root,  measuring 40 mm.   8. The  inferior vena cava is normal in size with greater than 50%  respiratory variability, suggesting right atrial pressure of 3 mmHg.   Comparison(s): Prior images reviewed side by side. Formed apical LV  thrombus has decreased in size.   Event Monitor: 11/2023   7 day monitor   Rare supraventricular ectopy in the form of isolated PACs, couplets, triplets   Rare ventricular ectopy in the form of isolated PVCs, couplets, triplets   No symptoms reported   Episodes of afib noted, 9% burden. Afib rates 48 to 123, avg 69 bpm.     Patch Wear Time:  6 days and 22 hours (2025-05-06T11:47:25-0400 to 2025-05-13T10:28:34-0400)   Patient had a min HR of 39 bpm, max HR of 123 bpm, and avg HR of 64 bpm. Predominant underlying rhythm was Sinus Rhythm. First Degree AV Block was present. Atrial Fibrillation/Flutter occurred (9% burden), ranging from 48-123 bpm (avg of 69 bpm), the  longest lasting 5 hours 37 mins with an avg rate of 67 bpm. Isolated SVEs were rare (<1.0%), SVE Couplets were rare (<1.0%), and SVE Triplets were rare (<1.0%). Isolated VEs  were rare (<1.0%, 2730), VE Couplets were rare (<1.0%, 74), and VE Triplets were  rare (<1.0%, 1). Ventricular Bigeminy and Trigeminy were present. Previously notified: MD notification criteria for First Documentation of Atrial Flutter met - report posted prior to notification (EX).    Laboratory Data: High Sensitivity Troponin:  No results for input(s): TROPONINIHS in the last 720 hours.   Chemistry Recent Labs  Lab 04/16/24 0809  NA 139  K 4.1  CL 102  CO2 25  GLUCOSE 177*  BUN 29*  CREATININE 1.69*  CALCIUM  9.4  GFRNONAA 42*  ANIONGAP 13    Recent Labs  Lab 04/16/24 0809  PROT 6.6  ALBUMIN 4.0  AST 24  ALT 19  ALKPHOS 68  BILITOT 0.4   Lipids No results for input(s): CHOL, TRIG, HDL, LABVLDL, LDLCALC, CHOLHDL in the last 168 hours.  Hematology Recent Labs  Lab 04/16/24 0809  WBC 13.3*  RBC 4.59  HGB 13.2  HCT 40.2   MCV 87.6  MCH 28.8  MCHC 32.8  RDW 13.7  PLT 261   Thyroid No results for input(s): TSH, FREET4 in the last 168 hours.  BNPNo results for input(s): BNP, PROBNP in the last 168 hours.  DDimer No results for input(s): DDIMER in the last 168 hours.  Radiology/Studies:  DG Chest Port 1 View Result Date: 04/16/2024 CLINICAL DATA:  pain EXAM: PORTABLE CHEST - 1 VIEW COMPARISON:  04/29/2023 FINDINGS: No focal airspace consolidation, pleural effusion, or pneumothorax. Mild cardiomegaly. Sternotomy wires and CABG changes. Tortuous aorta with aortic atherosclerosis. No acute fracture or destructive lesions. Multilevel thoracic osteophytosis. IMPRESSION: No acute cardiopulmonary abnormality. Electronically Signed   By: Rogelia Myers M.D.   On: 04/16/2024 08:33     Assessment and Plan:  1. Chest Pain with Mixed Features/CAD - He previously underwent CABG in 2013 as outlined above and cardiac catheterization in 08/2022 showed 4 out of 5 patent grafts with no targets for PCI. He does report more frequent episodes of chest pain within the past week which are more intense than his prior angina. Some episodes have occurred at rest though and his episode this morning lasted almost 3 hours with no improvement with ASA or SL NTG.  - Initial and repeat Hs troponin values are elevated at 26 and 38 and will recheck again. Of note, Hs troponin values did peak at 552 when checked in 08/2022.  - He says he would not want to undergo a repeat nuclear stress test as he almost died with Lexiscan . Therefore, options would include further titration of medical therapy vs. repeat cardiac catheterization. If enzymes remain flat, would prefer medical therapy given his stage III CKD. He has been on Imdur  120 mg in AM and this could be titrated or could add Ranexa. If enzymes trend upwards, would likely pursue cardiac catheterization tomorrow afternoon following washout of Eliquis . Continue ASA 81 mg daily and  Atorvastatin  80 mg daily.  2. Paroxysmal Atrial Fibrillation/Flutter - He is in rate controlled atrial flutter at this time. Would continue Toprol -XL 50 mg daily. - He is on Eliquis  5 mg twice daily for anticoagulation. Would hold Eliquis  in case he requires cardiac catheterization.  Can bridge with IV Heparin .  3. HTN - He reports his blood pressure is typically well-controlled but has been elevated today and was also elevated on Tuesday when he experienced symptoms. Will order PTA Amlodipine  5 mg daily and Benazepril  40 mg daily as he did not take these yet today. Did take Toprol -XL this  morning. Would hold HCTZ in case he requires cardiac catheterization.  4. HLD - LDL was at 46 when checked in 11/2023. Continue Atorvastatin  80mg  daily.   5. Stage 3 CKD - Creatinine at 1.69 on admission which is close to his known baseline.  6. Type 2 DM - Hgb A1c was at 9.1 when checked in 11/2023. Management per the admitting team.   7. History of LV thrombus - Noted on prior echocardiogram imaging. On Eliquis  prior to admission.   Risk Assessment/Risk Scores:  CHA2DS2-VASc Score = 5  This indicates a 7.2% annual risk of stroke. The patient's score is based upon: CHF History: 1 HTN History: 1 Diabetes History: 1 Stroke History: 0 Vascular Disease History: 1 Age Score: 1 Gender Score: 0    For questions or updates, please contact [facility] Please consult [email or web address] for contact info under   Signed, Martin CHRISTELLA Qua, PA-C  04/16/2024 11:05 AM

## 2024-04-16 NOTE — ED Notes (Signed)
 ED Provider at bedside.

## 2024-04-16 NOTE — Assessment & Plan Note (Signed)
-   Rate controlled with Toprol -XL 50 mg daily, and Eliquis  5 mg p.o. twice daily -Continue Toprol -XL, holding Eliquis  for now  Will be on Lovenox therapeutic dose Pending possible cardiac cath

## 2024-04-17 ENCOUNTER — Telehealth (HOSPITAL_COMMUNITY): Payer: Self-pay | Admitting: Pharmacy Technician

## 2024-04-17 ENCOUNTER — Other Ambulatory Visit (HOSPITAL_COMMUNITY): Payer: Self-pay

## 2024-04-17 ENCOUNTER — Telehealth: Payer: Self-pay | Admitting: Cardiology

## 2024-04-17 DIAGNOSIS — I2081 Angina pectoris with coronary microvascular dysfunction: Secondary | ICD-10-CM | POA: Diagnosis not present

## 2024-04-17 DIAGNOSIS — I4892 Unspecified atrial flutter: Secondary | ICD-10-CM

## 2024-04-17 DIAGNOSIS — I240 Acute coronary thrombosis not resulting in myocardial infarction: Secondary | ICD-10-CM

## 2024-04-17 DIAGNOSIS — I513 Intracardiac thrombosis, not elsewhere classified: Secondary | ICD-10-CM

## 2024-04-17 DIAGNOSIS — Z7901 Long term (current) use of anticoagulants: Secondary | ICD-10-CM | POA: Diagnosis not present

## 2024-04-17 LAB — GLUCOSE, CAPILLARY
Glucose-Capillary: 119 mg/dL — ABNORMAL HIGH (ref 70–99)
Glucose-Capillary: 229 mg/dL — ABNORMAL HIGH (ref 70–99)

## 2024-04-17 LAB — CBC
HCT: 35.8 % — ABNORMAL LOW (ref 39.0–52.0)
Hemoglobin: 12 g/dL — ABNORMAL LOW (ref 13.0–17.0)
MCH: 28.9 pg (ref 26.0–34.0)
MCHC: 33.5 g/dL (ref 30.0–36.0)
MCV: 86.3 fL (ref 80.0–100.0)
Platelets: 236 K/uL (ref 150–400)
RBC: 4.15 MIL/uL — ABNORMAL LOW (ref 4.22–5.81)
RDW: 13.7 % (ref 11.5–15.5)
WBC: 11.1 K/uL — ABNORMAL HIGH (ref 4.0–10.5)
nRBC: 0 % (ref 0.0–0.2)

## 2024-04-17 LAB — APTT
aPTT: 46 s — ABNORMAL HIGH (ref 24–36)
aPTT: 53 s — ABNORMAL HIGH (ref 24–36)

## 2024-04-17 LAB — TROPONIN T, HIGH SENSITIVITY: Troponin T High Sensitivity: 112 ng/L (ref 0–19)

## 2024-04-17 MED ORDER — WARFARIN SODIUM 5 MG PO TABS
5.0000 mg | ORAL_TABLET | Freq: Every day | ORAL | Status: DC
  Administered 2024-04-17: 5 mg via ORAL
  Filled 2024-04-17: qty 1

## 2024-04-17 MED ORDER — WARFARIN - PHARMACIST DOSING INPATIENT: Freq: Every day | Status: DC

## 2024-04-17 MED ORDER — ISOSORBIDE MONONITRATE ER 60 MG PO TB24: 60.0000 mg | ORAL_TABLET | Freq: Two times a day (BID) | ORAL | 1 refills | Status: DC

## 2024-04-17 MED ORDER — WARFARIN SODIUM 5 MG PO TABS: 5.0000 mg | ORAL_TABLET | Freq: Every day | ORAL | Status: DC

## 2024-04-17 MED ORDER — ASPIRIN 81 MG PO TBEC
81.0000 mg | DELAYED_RELEASE_TABLET | Freq: Every day | ORAL | Status: DC
  Administered 2024-04-17: 81 mg via ORAL
  Filled 2024-04-17: qty 1

## 2024-04-17 MED ORDER — FONDAPARINUX SODIUM 7.5 MG/0.6ML ~~LOC~~ SOLN
7.5000 mg | SUBCUTANEOUS | Status: DC
  Administered 2024-04-17: 7.5 mg via SUBCUTANEOUS
  Filled 2024-04-17 (×2): qty 0.6

## 2024-04-17 MED ORDER — ESOMEPRAZOLE MAGNESIUM 20 MG PO PACK: 40.0000 mg | PACK | Freq: Every day | ORAL | 12 refills | Status: AC

## 2024-04-17 MED ORDER — ISOSORBIDE MONONITRATE ER 60 MG PO TB24
60.0000 mg | ORAL_TABLET | Freq: Two times a day (BID) | ORAL | Status: DC
  Administered 2024-04-17: 60 mg via ORAL
  Filled 2024-04-17: qty 1

## 2024-04-17 MED ORDER — FONDAPARINUX SODIUM 7.5 MG/0.6ML ~~LOC~~ SOLN: 7.5000 mg | SUBCUTANEOUS | 0 refills | Status: DC

## 2024-04-17 MED ORDER — WARFARIN SODIUM 5 MG PO TABS: 5.0000 mg | ORAL_TABLET | Freq: Every day | ORAL | 2 refills | Status: DC

## 2024-04-17 NOTE — Progress Notes (Signed)
 Rounding Note   Patient Name: Martin Hooper Date of Encounter: 04/17/2024  [facility] Cardiologist: Alvan Carrier, MD   Subjective  Reports overall feeling well this morning. No recurrent chest pain. No palpitations and breathing at baseline. Has been NPO since midnight.   Scheduled Meds:  amLODipine   5 mg Oral Daily   aspirin   325 mg Oral Daily   benazepril   40 mg Oral Daily   insulin  aspart  0-9 Units Subcutaneous TID WC   isosorbide  mononitrate  120 mg Oral Daily   isosorbide  mononitrate  60 mg Oral QHS   metoprolol  succinate  50 mg Oral Daily   tamsulosin  0.4 mg Oral QHS   Continuous Infusions:  argatroban 1 mcg/kg/min (04/16/24 1854)   PRN Meds: acetaminophen , ALPRAZolam, alum & mag hydroxide-simeth **AND** [COMPLETED] lidocaine , morphine  injection, nitroGLYCERIN , ondansetron  (ZOFRAN ) IV   Vital Signs  Vitals:   04/16/24 2058 04/16/24 2058 04/16/24 2312 04/17/24 0345  BP: (!) 150/89 (!) 150/89 (!) 145/85 135/71  Pulse: 72 72 63 61  Resp: 18 18 18 18   Temp: 98.8 F (37.1 C) 98.8 F (37.1 C) 98.7 F (37.1 C) 98.6 F (37 C)  TempSrc: Oral Oral Oral   SpO2: 97% 97% 96% 94%  Weight:      Height:        Intake/Output Summary (Last 24 hours) at 04/17/2024 0826 Last data filed at 04/17/2024 0500 Gross per 24 hour  Intake 240 ml  Output --  Net 240 ml      04/16/2024    7:47 AM 12/31/2023    2:31 PM 11/19/2023   10:46 AM  Last 3 Weights  Weight (lbs) 200 lb 209 lb 12.8 oz 209 lb 12.8 oz  Weight (kg) 90.719 kg 95.165 kg 95.165 kg      Telemetry  Atrial flutter, HR in 60's to 70's.  - Personally Reviewed  Physical Exam  GEN: Pleasant male appearing in no acute distress.   Neck: No JVD Cardiac: Irregularly irregular, no murmurs, rubs, or gallops.  Respiratory: Clear to auscultation bilaterally. GI: Soft, nontender, non-distended  MS: No pitting edema; No deformity. Neuro:  Nonfocal  Psych: Normal affect   Labs High Sensitivity  Troponin:  No results for input(s): TROPONINIHS in the last 720 hours.   Chemistry Recent Labs  Lab 04/16/24 0809 04/16/24 1804  NA 139 139  K 4.1 4.1  CL 102 102  CO2 25 25  GLUCOSE 177* 288*  BUN 29* 25*  CREATININE 1.69* 1.63*  CALCIUM  9.4 9.1  PROT 6.6  --   ALBUMIN 4.0  --   AST 24  --   ALT 19  --   ALKPHOS 68  --   BILITOT 0.4  --   GFRNONAA 42* 44*  ANIONGAP 13 12    Lipids  Recent Labs  Lab 04/16/24 1227  CHOL 90  TRIG 93  HDL 31*  LDLCALC 41  CHOLHDL 2.9    Hematology Recent Labs  Lab 04/16/24 0809 04/16/24 1804 04/17/24 0558  WBC 13.3* 13.9* 11.1*  RBC 4.59 4.43 4.15*  HGB 13.2 12.7* 12.0*  HCT 40.2 39.0 35.8*  MCV 87.6 88.0 86.3  MCH 28.8 28.7 28.9  MCHC 32.8 32.6 33.5  RDW 13.7 13.5 13.7  PLT 261 249 236   Thyroid No results for input(s): TSH, FREET4 in the last 168 hours.  BNPNo results for input(s): BNP, PROBNP in the last 168 hours.  DDimer No results for input(s): DDIMER in the last 168  hours.   Radiology  DG Chest 2 View Result Date: 04/16/2024 EXAM: 2 VIEW(S) XRAY OF THE CHEST 04/16/2024 05:27:00 PM COMPARISON: 04/16/2024 CLINICAL HISTORY: chest pain age >35, hx of previous MI. Pt having chest pain. Hx of CAD, diabetes, hypertension, CABG. Non smoker FINDINGS: LUNGS AND PLEURA: No focal pulmonary opacity. No pulmonary edema. No pleural effusion. No pneumothorax. HEART AND MEDIASTINUM: Sternotomy wires noted. CABG markers noted. Aortic atherosclerosis. Mild cardiomegaly. BONES AND SOFT TISSUES: Moderate degenerative left glenohumeral arthropathy. Physiologic anterior wedging at the thoracolumbar junction appears unchanged. No acute osseous abnormality. IMPRESSION: 1. No acute findings. 2. Mild cardiomegaly. 3. Aortic atherosclerosis. 4. Moderate degenerative left glenohumeral arthropathy. Electronically signed by: Ryan Salvage MD 04/16/2024 05:54 PM EDT RP Workstation: [id number]    DG Chest Port 1 View Result Date:  04/16/2024 CLINICAL DATA:  pain EXAM: PORTABLE CHEST - 1 VIEW COMPARISON:  04/29/2023 FINDINGS: No focal airspace consolidation, pleural effusion, or pneumothorax. Mild cardiomegaly. Sternotomy wires and CABG changes. Tortuous aorta with aortic atherosclerosis. No acute fracture or destructive lesions. Multilevel thoracic osteophytosis. IMPRESSION: No acute cardiopulmonary abnormality. Electronically Signed   By: Rogelia Myers M.D.   On: 04/16/2024 08:33    Cardiac Studies  Echocardiogram: 04/16/2024 IMPRESSIONS     1. Small thrombus seen in the apex adjacent to apical inferior and apical  septal wall with surrounding slow flow.. Left ventricular ejection  fraction, by estimation, is 50%. Left ventricular ejection fraction by 3D  volume is 45 %. The left ventricle has   low normal function. The left ventricle demonstrates regional wall motion  abnormalities (see scoring diagram/findings for description). There is  mild left ventricular hypertrophy. Left ventricular diastolic function  could not be evaluated.   2. Right ventricular systolic function is normal. The right ventricular  size is normal.   3. Left atrial size was mild to moderately dilated.   4. The mitral valve is normal in structure. Trivial mitral valve  regurgitation. No evidence of mitral stenosis.   5. The aortic valve is tricuspid. Aortic valve regurgitation is not  visualized. No aortic stenosis is present.   6. Aortic dilatation noted. There is mild dilatation of the aortic root,  measuring 40 mm.   7. The inferior vena cava is normal in size with greater than 50%  respiratory variability, suggesting right atrial pressure of 3 mmHg.   Patient Profile   73 y.o. male wi/ PMH of CAD (s/p PCI in 2000, 5V CABG in 2013 with LIMA-LAD, seq SVG-OM2-OM3, SVG-D1, and SVG-PDA, cath in 08/2022 showing 4/5 patent grafts and no targets for PCI), chronic HFimpEF, paroxysmal atrial fibrillation (9% burden by monitor in 11/2023),   HTN, HLD, Type 2 DM, Stage 3 CKD and recurrent LV thrombus who is currently admitted for evaluation of chest pain.   Assessment & Plan   1. Chest Pain with Mixed Features/CAD - He underwent CABG in 2013 and catheterization in 08/2022 showed 4 out of 5 patent grafts with no targets for PCI.  His chest pain at the time of admission was overall atypical as pain had lasted for almost 3 hours and no improvement with ASA or nitroglycerin  but pain gradually resolved while in the ED. He denies any recurrent pain overnight or this morning.   - Hs troponin values have been mildly elevated, trending up to 112 this morning. Repeat echocardiogram shows his EF is at 50% with wall motion abnormalities noted but these have been documented on prior echocardiogram imaging as well. RV function is normal. -  Reviewed options with the patient in regards to possible cardiac catheterization given his uptrending enzymes but he prefers to hold off on this for now given no recurrent chest pain. We reviewed that arranging an outpatient cardiac catheterization would be more challenging once he is on Coumadin but he prefers to hold off on ischemic evaluation given no recurrent symptoms.   - He has been receiving Argatroban given his pork allergy and will plan to switch this to Coumadin with pharmacy assistance given no plans for cardiac catheterization. Dr. Mallipeddi did recommend Lovenox bridging and will arrange for an INR check next week.  - Continue ASA 81 mg daily, Atorvastatin  80 mg daily, Toprol -XL 50 mg daily and Imdur . He is listed as taking Imdur  120 mg in AM/60 mg in PM but reports having chest pain when he takes 120 mg at once. Will divide out dosing to 60 mg twice daily and can further titrate as an outpatient.    2. Paroxysmal Atrial Fibrillation/Flutter - Heart rate has been well-controlled in the 60's to 70's. Continue Toprol -XL 50 mg daily for rate control. - He was on Eliquis  for anticoagulation prior to admission  and the plan at this time is to switch to Coumadin for anticoagulation as discussed below given his residual LV thrombus.   3. HTN - BP was initially elevated while in the ED but has improved. He has been continued on Amlodipine  5mg  daily, Benazepril  40mg  daily and Toprol -XL 50mg  daily. Will adjust Imdur  dosing as outlined above.   4. HLD - LDL was at 46 when checked in 11/2023. Continue current medical therapy with Atorvastatin  80 mg daily.   5. Stage 3 CKD - Creatinine at 1.63 on most recent check which is close to his known baseline.  6. Type 2 DM - Hgb A1c was at 9.1 when checked in 11/2023. Needs close follow-up with his PCP as an outpatient.    7. LV thrombus - Noted on prior echocardiogram imaging and again noted on repeat echocardiogram this admission. Reviewed with Dr. Mallipeddi who recommended switching from Eliquis  to Coumadin given residual thrombus. Will make sure he has a close INR check next week.   For questions or updates, please contact [facility] Please consult [email or web address] for contact info under   Signed, Laymon CHRISTELLA Qua, PA-C  04/17/2024, 8:26 AM

## 2024-04-17 NOTE — Progress Notes (Signed)
 PHARMACY - ANTICOAGULATION CONSULT NOTE  Pharmacy Consult for argatroban  Indication: chest pain/ACS/afib/LV mural thrombus  Allergies  Allergen Reactions   Alpha-Gal Anaphylaxis    Any mammal meat    Beef (Bovine) Protein Anaphylaxis   Pork-Derived Products Anaphylaxis    Patient Measurements: Height: 5' 8 (172.7 cm) Weight: 90.7 kg (200 lb) IBW/kg (Calculated) : 68.4 HEPARIN  DW (KG): 87.1  Vital Signs: Temp: 98.7 F (37.1 C) (10/02 2312) Temp Source: Oral (10/02 2312) BP: 145/85 (10/02 2312) Pulse Rate: 63 (10/02 2312)  Labs: Recent Labs    04/16/24 0809 04/16/24 1804 04/16/24 1934 04/17/24 0002  HGB 13.2 12.7*  --   --   HCT 40.2 39.0  --   --   PLT 261 249  --   --   APTT  --   --  43* 53*  CREATININE 1.69* 1.63*  --   --     Estimated Creatinine Clearance: 44.1 mL/min (A) (by C-G formula based on SCr of 1.63 mg/dL (H)).   Medical History: Past Medical History:  Diagnosis Date   Allergy to alpha-gal    CAD (coronary artery disease)    reports CAD stents in 2000, 5v CABG around 2013.   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   04/15/2024 at 10:00 PM   aspirin  325 MG tablet Take 325 mg by mouth daily.   04/16/2024 Morning   atorvastatin  (LIPITOR) 80 MG tablet Take 80 mg by mouth daily.   04/15/2024 Evening   benazepril  (LOTENSIN ) 40 MG tablet Take 40 mg by mouth daily.   04/15/2024 Morning   COD LIVER OIL PO Take 1 tablet by mouth daily.   04/15/2024 Morning   Coenzyme Q10 (CO Q-10) 100 MG CAPS Take 1 capsule by mouth daily.    04/15/2024 Morning   esomeprazole (NEXIUM) 20 MG packet Take 20 mg by mouth daily before breakfast.   04/15/2024 Morning   hydrochlorothiazide  (HYDRODIURIL ) 25 MG tablet Take 25 mg by mouth daily.   04/15/2024 Morning   insulin  degludec (TRESIBA) 200 UNIT/ML FlexTouch Pen Inject 64-66 Units into the skin at bedtime.   04/15/2024 Bedtime   isosorbide  mononitrate (IMDUR ) 120 MG 24 hr tablet Take 120 mg by mouth daily.   04/15/2024 Morning   isosorbide  mononitrate (IMDUR ) 30 MG 24 hr tablet Take 30 mg by mouth daily as needed (take every evening as needed in addition to 120 mg daily.).   Unknown   MAGNESIUM GLYCINATE PO Take 210 mg by mouth 2 (two) times daily.   04/15/2024 Evening   metFORMIN (GLUCOPHAGE) 500 MG tablet Take 500-1,000 mg by mouth 2 (two) times daily with a meal. Take 2 tablet (1000 mg) in the morning and 1 tablets (500 mg) every evening.   04/15/2024 Evening   metoprolol  succinate (TOPROL -XL) 50 MG 24 hr tablet Take 1 tablet (50 mg total) by mouth daily. Take with or immediately following a meal. 90 tablet 1 04/16/2024 Morning   nitroGLYCERIN  (NITROSTAT ) 0.4 MG  SL tablet Place 1 tablet (0.4 mg total) under the tongue every 5 (five) minutes as needed for chest pain. 25 tablet 3 04/16/2024 Morning   tamsulosin (FLOMAX) 0.4 MG CAPS capsule Take 0.4 mg by mouth in the morning and at bedtime.   04/15/2024 Bedtime   VITAMIN D-VITAMIN K PO Take by mouth.   04/15/2024 Morning    Assessment: Patient on Eliquis  prior to admission, last dose 10/1 @ 2200.  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 Holding PTA Eliquis  due to possible procedure. He also has left ventricular mural thrombus along with afib.  10/3 AM update:  aPTT therapeutic at 53  Goal of Therapy:  Goal aPTT 50-90 Monitor platelets by anticoagulation protocol: Yes   Plan:  Cont Argatroban 1 mcg/kg/min aPTT with AM labs  Monitor H&H and platelets  Lynwood Mckusick, PharmD, BCPS Clinical  Pharmacist Phone: [phone]

## 2024-04-17 NOTE — Telephone Encounter (Signed)
 Patient Product/process development scientist completed.    The patient is insured through [employer]/ [employer]. Patient has Medicare and is not eligible for a copay card, but may be able to apply for patient assistance or Medicare RX Payment Plan (Patient Must reach out to their plan, if eligible for payment plan), if available.    Ran test claim for enoxaparin 100 mg/ml and the current 20 day co-pay is $0.00.  Ran test claim for fondaparinux  7.5mg /0.6 ml and the current 7 day co-pay is $0.00.  This test claim was processed through [facility]- copay amounts may vary at other pharmacies due to pharmacy/plan contracts, or as the patient moves through the different stages of their insurance plan.     Reyes Sharps, CPHT Pharmacy Technician III Certified Patient Advocate [facility] Patient Advocate Team Direct Number: [phone]  Fax: [phone]

## 2024-04-17 NOTE — Care Management Obs Status (Signed)
 MEDICARE OBSERVATION STATUS NOTIFICATION   Patient Details  Name: Martin Hooper MRN: [id number] Date of Birth: Oct 27, 1950   Medicare Observation Status Notification Given:  Yes    Duwaine LITTIE Ada 04/17/2024, 11:35 AM

## 2024-04-17 NOTE — Progress Notes (Signed)
 PHARMACY - ANTICOAGULATION CONSULT NOTE  Pharmacy Consult for argatroban >>arixtra /warfarin Indication: chest pain/ACS/afib/LV mural thrombus  Allergies  Allergen Reactions   Alpha-Gal Anaphylaxis    Any mammal meat    Beef (Bovine) Protein Anaphylaxis   Pork-Derived Products Anaphylaxis    Patient Measurements: Height: 5' 8 (172.7 cm) Weight: 90.7 kg (200 lb) IBW/kg (Calculated) : 68.4 HEPARIN  DW (KG): 87.1  Vital Signs: Temp: 98.6 F (37 C) (10/03 0345) Temp Source: Oral (10/02 2312) BP: 135/71 (10/03 0345) Pulse Rate: 61 (10/03 0345)  Labs: Recent Labs    04/16/24 0809 04/16/24 1804 04/16/24 1934 04/17/24 0002 04/17/24 0558  HGB 13.2 12.7*  --   --  12.0*  HCT 40.2 39.0  --   --  35.8*  PLT 261 249  --   --  236  APTT  --   --  43* 53* 46*  CREATININE 1.69* 1.63*  --   --   --     Estimated Creatinine Clearance: 44.1 mL/min (A) (by C-G formula based on SCr of 1.63 mg/dL (H)).   Medical History: Past Medical History:  Diagnosis Date   Allergy to alpha-gal    CAD (coronary artery disease)    reports CAD stents in 2000, 5v CABG around 2013.   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   04/15/2024 at 10:00 PM   aspirin  325 MG tablet Take 325 mg by mouth daily.   04/16/2024 Morning   atorvastatin  (LIPITOR) 80 MG tablet Take 80 mg by mouth daily.   04/15/2024 Evening   benazepril  (LOTENSIN ) 40 MG tablet Take 40 mg by mouth daily.   04/15/2024 Morning   COD LIVER OIL PO Take 1 tablet by mouth daily.   04/15/2024 Morning   Coenzyme Q10 (CO  Q-10) 100 MG CAPS Take 1 capsule by mouth daily.   04/15/2024 Morning   esomeprazole (NEXIUM) 20 MG packet Take 20 mg by mouth daily before breakfast.   04/15/2024 Morning   hydrochlorothiazide  (HYDRODIURIL ) 25 MG tablet Take 25 mg by mouth daily.   04/15/2024 Morning   insulin  degludec (TRESIBA) 200 UNIT/ML FlexTouch Pen Inject 64-66 Units into the skin at bedtime.   04/15/2024 Bedtime   isosorbide  mononitrate (IMDUR ) 120 MG 24 hr tablet Take 120 mg by mouth daily.   04/15/2024 Morning   isosorbide  mononitrate (IMDUR ) 30 MG 24 hr tablet Take 30 mg by mouth daily as needed (take every evening as needed in addition to 120 mg daily.).   Unknown   MAGNESIUM GLYCINATE PO Take 210 mg by mouth 2 (two) times daily.   04/15/2024 Evening   metFORMIN (GLUCOPHAGE) 500 MG tablet Take 500-1,000 mg by mouth 2 (two) times daily with a meal. Take 2 tablet (1000 mg) in the morning and 1 tablets (500 mg) every evening.   04/15/2024 Evening   metoprolol  succinate (TOPROL -XL) 50 MG 24 hr tablet Take 1 tablet (50 mg total) by mouth daily. Take with or immediately following a meal. 90 tablet  1 04/16/2024 Morning   nitroGLYCERIN  (NITROSTAT ) 0.4 MG SL tablet Place 1 tablet (0.4 mg total) under the tongue every 5 (five) minutes as needed for chest pain. 25 tablet 3 04/16/2024 Morning   tamsulosin (FLOMAX) 0.4 MG CAPS capsule Take 0.4 mg by mouth in the morning and at bedtime.   04/15/2024 Bedtime   VITAMIN D-VITAMIN K PO Take by mouth.   04/15/2024 Morning    Assessment: Patient on Eliquis  prior to admission, last dose 10/1 @ 2200.  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 Holding PTA Eliquis  due to possible procedure. He also has left ventricular mural thrombus along with afib.  Discussed with team this morning, patient declining cath at this time. New orders received to resume anticoagulation for discharge. Will change to arixtra  and start warfarin today. Provided education this morning.   Goal of  Therapy:  INR goal 2-3 Goal aPTT 50-90 Monitor platelets by anticoagulation protocol: Yes   Plan:  Argatroban stopped>>arixtra  7.5mg  daily started Warfarin 5mg  daily with INR check next Wednesday   Dempsey Blush PharmD., BCPS Clinical Pharmacist 04/17/2024 9:33 AM

## 2024-04-17 NOTE — Plan of Care (Signed)
  Problem: Fluid Volume: Goal: Ability to maintain a balanced intake and output will improve Outcome: Progressing   Problem: Health Behavior/Discharge Planning: Goal: Ability to identify and utilize available resources and services will improve Outcome: Progressing Goal: Ability to manage health-related needs will improve Outcome: Progressing   Problem: Skin Integrity: Goal: Risk for impaired skin integrity will decrease Outcome: Progressing   Problem: Tissue Perfusion: Goal: Adequacy of tissue perfusion will improve Outcome: Progressing   Problem: Health Behavior/Discharge Planning: Goal: Ability to manage health-related needs will improve Outcome: Progressing   Problem: Clinical Measurements: Goal: Ability to maintain clinical measurements within normal limits will improve Outcome: Progressing Goal: Will remain free from infection Outcome: Progressing   Problem: Activity: Goal: Risk for activity intolerance will decrease Outcome: Progressing   Problem: Coping: Goal: Level of anxiety will decrease Outcome: Progressing   Problem: Safety: Goal: Ability to remain free from injury will improve Outcome: Progressing   Problem: Skin Integrity: Goal: Risk for impaired skin integrity will decrease Outcome: Progressing

## 2024-04-17 NOTE — Telephone Encounter (Signed)
 Pt c/o medication issue:  1. Name of Medication:   aspirin  325 MG tablet   2. How are you currently taking this medication (dosage and times per day)?   3. Are you having a reaction (difficulty breathing--STAT)?   4. What is your medication issue?   Patient stated he was recently released from [facility] and wants to know if he should be taking this medication.

## 2024-04-17 NOTE — Discharge Instructions (Addendum)
Information on my medicine - Coumadin®   (Warfarin) ° °Why was Coumadin prescribed for you? °Coumadin was prescribed for you because you have a blood clot or a medical condition that can cause an increased risk of forming blood clots. Blood clots can cause serious health problems by blocking the flow of blood to the heart, lung, or brain. Coumadin can prevent harmful blood clots from forming. °As a reminder your indication for Coumadin is:   LV thrombus ° °What test will check on my response to Coumadin? °While on Coumadin (warfarin) you will need to have an INR test regularly to ensure that your dose is keeping you in the desired range. The INR (international normalized ratio) number is calculated from the result of the laboratory test called prothrombin time (PT). ° °If an INR APPOINTMENT HAS NOT ALREADY BEEN MADE FOR YOU please schedule an appointment to have this lab work done by your health care provider within 7 days. °Your INR goal is usually a number between:  2 to 3 or your provider may give you a more narrow range like 2-2.5.  Ask your health care provider during an office visit what your goal INR is. ° °What  do you need to  know  About  COUMADIN? °Take Coumadin (warfarin) exactly as prescribed by your healthcare provider about the same time each day.  DO NOT stop taking without talking to the doctor who prescribed the medication.  Stopping without other blood clot prevention medication to take the place of Coumadin may increase your risk of developing a new clot or stroke.  Get refills before you run out. ° °What do you do if you miss a dose? °If you miss a dose, take it as soon as you remember on the same day then continue your regularly scheduled regimen the next day.  Do not take two doses of Coumadin at the same time. ° °Important Safety Information °A possible side effect of Coumadin (Warfarin) is an increased risk of bleeding. You should call your healthcare provider right away if you experience  any of the following: °? Bleeding from an injury or your nose that does not stop. °? Unusual colored urine (red or dark brown) or unusual colored stools (red or black). °? Unusual bruising for unknown reasons. °? A serious fall or if you hit your head (even if there is no bleeding). ° °Some foods or medicines interact with Coumadin® (warfarin) and might alter your response to warfarin. To help avoid this: °? Eat a balanced diet, maintaining a consistent amount of Vitamin K. °? Notify your provider about major diet changes you plan to make. °? Avoid alcohol or limit your intake to 1 drink for women and 2 drinks for men per day. °(1 drink is 5 oz. wine, 12 oz. beer, or 1.5 oz. liquor.) ° °Make sure that ANY health care provider who prescribes medication for you knows that you are taking Coumadin (warfarin).  Also make sure the healthcare provider who is monitoring your Coumadin knows when you have started a new medication including herbals and non-prescription products. ° °Coumadin® (Warfarin)  Major Drug Interactions  °Increased Warfarin Effect Decreased Warfarin Effect  °Alcohol (large quantities) °Antibiotics (esp. Septra/Bactrim, Flagyl, Cipro) °Amiodarone (Cordarone) °Aspirin (ASA) °Cimetidine (Tagamet) °Megestrol (Megace) °NSAIDs (ibuprofen, naproxen, etc.) °Piroxicam (Feldene) °Propafenone (Rythmol SR) °Propranolol (Inderal) °Isoniazid (INH) °Posaconazole (Noxafil) Barbiturates (Phenobarbital) °Carbamazepine (Tegretol) °Chlordiazepoxide (Librium) °Cholestyramine (Questran) °Griseofulvin °Oral Contraceptives °Rifampin °Sucralfate (Carafate) °Vitamin K  ° °Coumadin® (Warfarin) Major Herbal Interactions  °Increased Warfarin   Effect Decreased Warfarin Effect  °Garlic °Ginseng °Ginkgo biloba Coenzyme Q10 °Green tea °St. John’s wort   ° °Coumadin® (Warfarin) FOOD Interactions  °Eat a consistent number of servings per week of foods HIGH in Vitamin K °(1 serving = ½ cup)  °Collards (cooked, or boiled & drained) °Kale  (cooked, or boiled & drained) °Mustard greens (cooked, or boiled & drained) °Parsley *serving size only = ¼ cup °Spinach (cooked, or boiled & drained) °Swiss chard (cooked, or boiled & drained) °Turnip greens (cooked, or boiled & drained)  °Eat a consistent number of servings per week of foods MEDIUM-HIGH in Vitamin K °(1 serving = 1 cup)  °Asparagus (cooked, or boiled & drained) °Broccoli (cooked, boiled & drained, or raw & chopped) °Brussel sprouts (cooked, or boiled & drained) *serving size only = ½ cup °Lettuce, raw (green leaf, endive, romaine) °Spinach, raw °Turnip greens, raw & chopped  ° °These websites have more information on Coumadin (warfarin):  www.coumadin.com; °www.ahrq.gov/consumer/coumadin.htm; ° ° ° °

## 2024-04-17 NOTE — Telephone Encounter (Signed)
 Will route to pharmD for advice.

## 2024-04-17 NOTE — Discharge Summary (Signed)
 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 *  Hospital Course: Martin Hooper is a 73 year old male with extensive history of coronary artery disease, 5V CABG in 2013, heart failure, P A-fib recurrent left V thrombus, on Eliquis , aspirin , HTN, HLD, DM2, stage III CKD .... Presented today with chief complaint of chest pain.  Patient reports chest pain started about 8:00 this morning, substernal with no radiation was associated with diaphoresis, but no shortness of breath. Subsequently took nitroglycerin  every 5 minutes x 3, aspirin . By time he arrived to ED chest pain resolved. He also reports that his blood sugar was about 69 this morning.   ED evaluation: Blood pressure (!) 163/86, pulse 74, temperature 97.9 F (36.6 C), temperature source Oral, resp. rate 13, height 5' 8 (1.727 m), weight 90.7 kg, SpO2 94%.  LABs; potassium 4.1, glucose 177, BUN 29, creatinine 1.69 (baseline 1.5-1.6), GFR 42, BBC 13.3, Troponin 26, 38, 52, EKG a flutter rate control, HR at 86  EDP Dr. Suzette discussed the case with cardiology who recommended patient to be admitted  for further evaluation possible cardiac catheterization if symptoms persist and elevated troponin  Assessment and Plan: * Chest pain Chest pain resolved Acute recurrent MI was ruled out EKG-no significant acute abnormality noted -The patient and cardiology decided not to pursue cardiac catheterization  - Troponin 26 >> 38 >>52 >>> 112 -Patient was on aspirin , Eliquis  and high-dose statins at home  Echocardiogram reviewed by cardiology reporting EF 45%, still revealing akinesis in the mid anteroseptal wall, still revealing aneurysmal changes and apex with a small thrombus adjacent to apical inferior and apical septal wall  Cardiology recommended transitioning patient to Coumadin for slow resolution of the left LV thrombus.  Due to patient's allergy to heparin /Lovenox patient will be discharged on Arixtra  plus Coumadin with INR goal of 2.0  Patient instructed to follow-up with cardiology closely-likely this Monday, 04/20/2024   - Extensive history of coronary artery disease, CABG-continue current meds per cardiology recommendations.   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 anticoagulated on Eliquis  Echocardiogram reviewed, persistent LV thrombus although small - Per cardiology switch from Eliquis  to Coumadin bridging with Arixtra   HLD (hyperlipidemia) Continuing high intensity statins, currently on Lipitor 80 mg daily Last LDL at 46 on 5/25 Checking lipid panel  HTN, goal below 130/80 - Elevated BP today -not take his medication aside from Toprol -XL -Reviewing home medication, will continue Toprol -XL, Imdur , Benzepril, , HCTZ,          Consultants: Cardiology Procedures performed: none   Disposition: Home Diet recommendation:  Discharge Diet Orders (From admission, onward)     Start     Ordered   04/17/24 0000  Diet - low sodium heart healthy        04/17/24 1226           Cardiac and Carb modified diet DISCHARGE MEDICATION: Allergies as of 04/17/2024       Reactions   Alpha-gal Anaphylaxis   Any mammal meat    Beef (bovine) Protein Anaphylaxis   Pork-derived Products Anaphylaxis        Medication List     STOP taking these medications    Eliquis  5 MG Tabs tablet Generic drug: apixaban        TAKE these medications    amLODipine  5 MG tablet Commonly known as: NORVASC  Take 1 tablet by mouth once daily   aspirin  325 MG tablet Take 325 mg by mouth daily.   atorvastatin  80 MG tablet Commonly known as: LIPITOR Take 80 mg by mouth daily.   benazepril  40 MG tablet Commonly known as: LOTENSIN  Take 40 mg by mouth daily.   Co Q-10 100 MG Caps Take 1 capsule by mouth daily.   COD LIVER OIL PO Take 1 tablet by mouth daily.   esomeprazole 20 MG packet Commonly known as: NEXIUM Take 40 mg by mouth daily before breakfast. What changed: how much to take   fondaparinux  7.5 MG/0.6ML Soln injection Commonly known as: ARIXTRA  Inject 0.6 mLs (7.5 mg total) into the  skin daily for 7 days. Start taking on: April 18, 2024   hydrochlorothiazide  25 MG tablet Commonly known as: HYDRODIURIL  Take 25 mg by mouth daily.   insulin  degludec 200 UNIT/ML FlexTouch Pen Commonly known as: TRESIBA Inject 64-66 Units into the skin at bedtime.   isosorbide  mononitrate 30 MG 24 hr tablet Commonly known as: IMDUR  Take 30 mg by mouth daily as needed (take every evening as needed in addition to 120 mg daily.). What changed: Another medication with the same name was changed. Make sure you understand how and when to take each.   isosorbide  mononitrate 60 MG 24 hr tablet Commonly known as: IMDUR  Take 1 tablet (60 mg total) by mouth 2 (two) times daily. What changed:  medication strength how much to take when to take this   MAGNESIUM GLYCINATE PO Take 210 mg by mouth 2 (two) times daily.   metFORMIN 500 MG tablet Commonly known as: GLUCOPHAGE Take 500-1,000 mg by mouth 2 (two) times daily with a meal. Take 2 tablet (1000 mg) in the morning and 1 tablets (500 mg) every evening.   metoprolol  succinate 50 MG 24 hr tablet Commonly known as: TOPROL -XL Take 1 tablet (50 mg total) by mouth daily. Take with or immediately following a meal.   nitroGLYCERIN  0.4 MG  SL tablet Commonly known as: NITROSTAT  Place 1 tablet (0.4 mg total) under the tongue every 5 (five) minutes as needed for chest pain.   tamsulosin 0.4 MG Caps capsule Commonly known as: FLOMAX Take 0.4 mg by mouth in the morning and at bedtime.   VITAMIN D-VITAMIN K PO Take by mouth.   warfarin 5 MG tablet Commonly known as: COUMADIN Take 1 tablet (5 mg total) by mouth daily at 4 PM.        Follow-up Information     [facility] at [facility] Follow up.   Specialty: Cardiology Why: INR Check on 04/22/2024 at 10:00 AM. Contact information: [address] [place]  [address] [phone]        Johnson Laymon HERO, PA-C Follow up on 05/08/2024.   Specialties:  Cardiology, Cardiology Why: Cardiology Hospital Follow-up on 05/08/2024 at 3:30 PM. Contact information: [address] [place] [address] [phone]                Discharge Exam: Fredricka Weights   04/16/24 0747  Weight: 90.7 kg        General:  AAO x 3,  cooperative, no distress;   HEENT:  Normocephalic, PERRL, otherwise with in Normal limits   Neuro:  CNII-XII intact. , normal motor and sensation, reflexes intact   Lungs:   Clear to auscultation BL, Respirations unlabored,  No wheezes / crackles  Cardio:    S1/S2, RRR, No murmure, No Rubs or Gallops   Abdomen:  Soft, non-tender, bowel sounds active all four quadrants, no guarding or peritoneal signs.  Muscular  skeletal:  Limited exam -global generalized weaknesses - in bed, able to move all 4 extremities,   2+ pulses,  symmetric, No pitting edema  Skin:  Dry, warm to touch, negative for any Rashes,  Wounds: Please see nursing documentation         Condition at discharge: good  The results of significant diagnostics from this hospitalization (including imaging, microbiology, ancillary and laboratory) are listed below for reference.   Imaging Studies: DG Chest 2 View Result Date: 04/16/2024 EXAM: 2 VIEW(S) XRAY OF THE CHEST 04/16/2024 05:27:00 PM COMPARISON: 04/16/2024 CLINICAL HISTORY: chest pain age >35, hx of previous MI. Pt having chest pain. Hx of CAD, diabetes, hypertension, CABG. Non smoker FINDINGS: LUNGS AND PLEURA: No focal pulmonary opacity. No pulmonary edema. No pleural effusion. No pneumothorax. HEART AND MEDIASTINUM: Sternotomy wires noted. CABG markers noted. Aortic atherosclerosis. Mild cardiomegaly. BONES AND SOFT TISSUES: Moderate degenerative left glenohumeral arthropathy. Physiologic anterior wedging at the thoracolumbar junction appears unchanged. No acute osseous abnormality. IMPRESSION: 1. No acute findings. 2. Mild cardiomegaly. 3. Aortic atherosclerosis. 4. Moderate degenerative left  glenohumeral arthropathy. Electronically signed by: Ryan Salvage MD 04/16/2024 05:54 PM EDT RP Workstation: [id number]   ECHOCARDIOGRAM COMPLETE Result Date: 04/16/2024    ECHOCARDIOGRAM REPORT   Patient Name:   UDELL MAZZOCCO [facility] Date of Exam: 04/16/2024 Medical Rec #:  [id number]         Height:       68.0 in Accession #:    [id number]        Weight:       200.0 lb Date of Birth:  1951/06/05         BSA:          2.044 m Patient Age:    73 years          BP:  180/83 mmHg Patient Gender: M                 HR:           65 bpm. Exam Location:  [facility] Procedure: 2D Echo, Cardiac Doppler, Color Doppler and Intracardiac            Opacification Agent (Both Spectral and Color Flow Doppler were            utilized during procedure). Indications:    Chest Pain R07.9  History:        Patient has prior history of Echocardiogram examinations, most                 recent 01/18/2023. CHF, CAD and Previous Myocardial Infarction,                 Prior CABG; Risk Factors:Hypertension, Diabetes and                 Dyslipidemia.  Sonographer:    Aida Pizza RCS Referring Phys: [phone] Obinna Ehresman A Deaundra Dupriest IMPRESSIONS  1. Small thrombus seen in the apex adjacent to apical inferior and apical septal wall with surrounding slow flow.. Left ventricular ejection fraction, by estimation, is 50%. Left ventricular ejection fraction by 3D volume is 45 %. The left ventricle has  low normal function. The left ventricle demonstrates regional wall motion abnormalities (see scoring diagram/findings for description). There is mild left ventricular hypertrophy. Left ventricular diastolic function could not be evaluated.  2. Right ventricular systolic function is normal. The right ventricular size is normal.  3. Left atrial size was mild to moderately dilated.  4. The mitral valve is normal in structure. Trivial mitral valve regurgitation. No evidence of mitral stenosis.  5. The aortic valve is tricuspid. Aortic valve regurgitation is  not visualized. No aortic stenosis is present.  6. Aortic dilatation noted. There is mild dilatation of the aortic root, measuring 40 mm.  7. The inferior vena cava is normal in size with greater than 50% respiratory variability, suggesting right atrial pressure of 3 mmHg. FINDINGS  Left Ventricle: Small thrombus seen in the apex adjacent to apical inferior and apical septal wall with surrounding slow flow. Left ventricular ejection fraction, by estimation, is 50%. Left ventricular ejection fraction by 3D volume is 45 %. The left ventricle has low normal function. The left ventricle demonstrates regional wall motion abnormalities. Definity  contrast agent was given IV to delineate the left ventricular endocardial borders. Strain was performed and the global longitudinal strain is indeterminate. The left ventricular internal cavity size was normal in size. There is mild left ventricular hypertrophy. Left ventricular diastolic function could not be evaluated due to atrial fibrillation. Left ventricular diastolic function could not be evaluated.  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 The mid anteroseptal segment is akinetic. The entire anterior wall, entire lateral wall, inferior wall, basal anteroseptal segment, mid inferoseptal segment, and basal inferoseptal segment are normal. Right Ventricle: The right ventricular size is normal. No increase in right ventricular wall thickness. Right ventricular systolic function is normal. Left Atrium: Left atrial size was mild to moderately dilated. Right Atrium: Right atrial size was normal in size. Pericardium: There is no evidence of pericardial effusion. Mitral Valve: The mitral valve is normal in structure. Trivial mitral valve regurgitation. No evidence of mitral valve stenosis. Tricuspid Valve: The tricuspid valve is normal in structure. Tricuspid valve regurgitation is trivial. No evidence of tricuspid stenosis.  Aortic Valve: The aortic valve is tricuspid. Aortic valve regurgitation is not visualized. No aortic stenosis is present. Pulmonic Valve: The pulmonic valve was normal in structure. Pulmonic valve regurgitation is trivial. No evidence of pulmonic stenosis. Aorta: Aortic dilatation noted. There is mild dilatation of the aortic root, measuring 40 mm. Venous: The inferior vena cava is normal in size with greater than 50% respiratory variability, suggesting right atrial pressure of 3 mmHg. IAS/Shunts: No atrial level shunt detected by color flow Doppler. Additional Comments: 3D was performed not requiring image post processing on an independent workstation and was abnormal.  LEFT VENTRICLE PLAX 2D LVIDd:         4.60 cm         Diastology LVIDs:         3.40 cm         LV e' medial:    8.95 cm/s LV PW:         1.20 cm         LV E/e' medial:  12.6 LV IVS:        1.30 cm         LV e' lateral:   10.95 cm/s LVOT diam:     1.90 cm         LV E/e' lateral: 10.3 LV SV:         60 LV SV Index:   29 LVOT Area:     2.84 cm        3D Volume EF                                LV 3D EF:    Left                                             ventricul                                             ar                                             ejection                                             fraction                                             by 3D                                             volume is  45 %.                                 3D Volume EF:                                3D EF:        45 %                                LV EDV:       139 ml                                LV ESV:       77 ml                                LV SV:        62 ml RIGHT VENTRICLE RV S prime:     13.30 cm/s TAPSE (M-mode): 2.0 cm LEFT ATRIUM           Index        RIGHT ATRIUM           Index LA diam:      4.40 cm 2.15 cm/m   RA Area:     16.90 cm LA Vol (A2C): 83.8 ml 41.00 ml/m  RA Volume:   45.70  ml  22.36 ml/m LA Vol (A4C): 83.7 ml 40.95 ml/m  AORTIC VALVE LVOT Vmax:   106.00 cm/s LVOT Vmean:  63.500 cm/s LVOT VTI:    0.211 m  AORTA Ao Root diam: 3.85 cm MITRAL VALVE MV Area (PHT): 4.80 cm     SHUNTS MV Decel Time: 158 msec     Systemic VTI:  0.21 m MV E velocity: 113.00 cm/s  Systemic Diam: 1.90 cm MV A velocity: 58.95 cm/s MV E/A ratio:  1.92 Vishnu Priya Mallipeddi Electronically signed by Diannah Late Mallipeddi Signature Date/Time: 04/16/2024/4:26:57 PM    Final    DG Chest Port 1 View Result Date: 04/16/2024 CLINICAL DATA:  pain EXAM: PORTABLE CHEST - 1 VIEW COMPARISON:  04/29/2023 FINDINGS: No focal airspace consolidation, pleural effusion, or pneumothorax. Mild cardiomegaly. Sternotomy wires and CABG changes. Tortuous aorta with aortic atherosclerosis. No acute fracture or destructive lesions. Multilevel thoracic osteophytosis. IMPRESSION: No acute cardiopulmonary abnormality. Electronically Signed   By: Rogelia Myers M.D.   On: 04/16/2024 08:33    Microbiology: No results found for this or any previous visit.  Labs: CBC: Recent Labs  Lab 04/16/24 0809 04/16/24 1804 04/17/24 0558  WBC 13.3* 13.9* 11.1*  NEUTROABS 10.5*  --   --   HGB 13.2 12.7* 12.0*  HCT 40.2 39.0 35.8*  MCV 87.6 88.0 86.3  PLT 261 249 236   Basic Metabolic Panel: Recent Labs  Lab 04/16/24 0809 04/16/24 1804  NA 139 139  K 4.1 4.1  CL 102 102  CO2 25 25  GLUCOSE 177* 288*  BUN 29* 25*  CREATININE 1.69* 1.63*  CALCIUM  9.4 9.1   Liver Function Tests: Recent Labs  Lab 04/16/24 0809  AST 24  ALT 19  ALKPHOS 68  BILITOT 0.4  PROT 6.6  ALBUMIN 4.0   CBG: Recent Labs  Lab 04/16/24 1615 04/16/24 2100 04/17/24 0715 04/17/24 1112  GLUCAP 70 336* 119* 229*    Discharge time spent: greater than 30 minutes.  Signed: Adriana DELENA Grams, MD [facility] 04/17/2024

## 2024-04-17 NOTE — Progress Notes (Addendum)
 Date and time results received: 04/17/24,  0654   Test: Troponin Critical Value: 1.12  Name of Provider Notified: Adriana Grams Day Shift Nurse Notified Verbally: Zane Sick, LPN   No new orders rec'd.

## 2024-04-20 NOTE — Telephone Encounter (Signed)
 Per Dr. Mallipeddi note no need to be on aspirin  while on systemic [facility]  Patient should not be taking aspirin .

## 2024-04-20 NOTE — Telephone Encounter (Signed)
 Patient notified and verbalized understanding.

## 2024-04-22 ENCOUNTER — Ambulatory Visit: Attending: Cardiology | Admitting: *Deleted

## 2024-04-22 DIAGNOSIS — I513 Intracardiac thrombosis, not elsewhere classified: Secondary | ICD-10-CM

## 2024-04-22 DIAGNOSIS — Z5181 Encounter for therapeutic drug level monitoring: Secondary | ICD-10-CM | POA: Insufficient documentation

## 2024-04-22 LAB — POCT INR: INR: 2.3 (ref 2.0–3.0)

## 2024-04-22 NOTE — Patient Instructions (Signed)
 Started warfarin 5mg  daily on 04/17/24 Continue warfarin 1 tablet daily Recheck in 1 wk

## 2024-04-22 NOTE — Progress Notes (Signed)
 INR-2.3; Please see anticoagulation encounter

## 2024-04-24 ENCOUNTER — Telehealth: Payer: Self-pay | Admitting: Cardiology

## 2024-04-24 NOTE — Telephone Encounter (Signed)
 Spoke with patient and advised since he missed his warfarin dose last night (1 tablet) then he should take an extra 1/2 tablet today and keep his normal 1 tablet today as well (total of 1.5 tablets). He verbalized understanding.

## 2024-04-24 NOTE — Telephone Encounter (Signed)
 Pt c/o medication issue:  1. Name of Medication:   warfarin (COUMADIN) 5 MG tablet   2. How are you currently taking this medication (dosage and times per day)?   3. Are you having a reaction (difficulty breathing--STAT)?   4. What is your medication issue?    Patient stated he forgot to take his medication last night and wants to know if he should take 1/2 medication until his next regular dose is due.

## 2024-04-24 NOTE — Telephone Encounter (Signed)
 Returned call to the patient and had to leave a message to call back regarding his message.

## 2024-04-28 ENCOUNTER — Other Ambulatory Visit: Payer: Self-pay | Admitting: Cardiology

## 2024-04-29 ENCOUNTER — Ambulatory Visit: Attending: Cardiology | Admitting: *Deleted

## 2024-04-29 DIAGNOSIS — Z5181 Encounter for therapeutic drug level monitoring: Secondary | ICD-10-CM | POA: Diagnosis not present

## 2024-04-29 DIAGNOSIS — I513 Intracardiac thrombosis, not elsewhere classified: Secondary | ICD-10-CM | POA: Diagnosis not present

## 2024-04-29 LAB — POCT INR: INR: 2.7 (ref 2.0–3.0)

## 2024-04-29 NOTE — Patient Instructions (Signed)
 Started warfarin 5mg  daily on 04/17/24 Continue warfarin 1 tablet daily Recheck in 2 wk

## 2024-04-29 NOTE — Progress Notes (Signed)
 INR 2.7; Please see anticoagulation encounter

## 2024-05-08 ENCOUNTER — Ambulatory Visit: Attending: Student | Admitting: Student

## 2024-05-08 ENCOUNTER — Encounter: Payer: Self-pay | Admitting: Student

## 2024-05-08 VITALS — BP 126/70 | HR 74 | Ht 68.0 in | Wt 208.0 lb

## 2024-05-08 DIAGNOSIS — I25118 Atherosclerotic heart disease of native coronary artery with other forms of angina pectoris: Secondary | ICD-10-CM | POA: Diagnosis not present

## 2024-05-08 DIAGNOSIS — I4892 Unspecified atrial flutter: Secondary | ICD-10-CM

## 2024-05-08 DIAGNOSIS — I10 Essential (primary) hypertension: Secondary | ICD-10-CM

## 2024-05-08 DIAGNOSIS — I513 Intracardiac thrombosis, not elsewhere classified: Secondary | ICD-10-CM

## 2024-05-08 DIAGNOSIS — E785 Hyperlipidemia, unspecified: Secondary | ICD-10-CM

## 2024-05-08 NOTE — Patient Instructions (Signed)
 Medication Instructions:  Your physician recommends that you continue on your current medications as directed. Please refer to the Current Medication list given to you today.  *If you need a refill on your cardiac medications before your next appointment, please call your pharmacy*  Lab Work: NONE   If you have labs (blood work) drawn today and your tests are completely normal, you will receive your results only by: MyChart Message (if you have MyChart) OR A paper copy in the mail If you have any lab test that is abnormal or we need to change your treatment, we will call you to review the results.  Testing/Procedures: NONE   Follow-Up: At [facility], you and your health needs are our priority.  As part of our continuing mission to provide you with exceptional heart care, our providers are all part of one team.  This team includes your primary Cardiologist (physician) and Advanced Practice Providers or APPs (Physician Assistants and Nurse Practitioners) who all work together to provide you with the care you need, when you need it.  Your next appointment:   3 -4 month(s)  Provider:   Dorn Ross, MD    We recommend signing up for the patient portal called MyChart.  Sign up information is provided on this After Visit Summary.  MyChart is used to connect with patients for Virtual Visits (Telemedicine).  Patients are able to view lab/test results, encounter notes, upcoming appointments, etc.  Non-urgent messages can be sent to your provider as well.   To learn more about what you can do with MyChart, go to [email or web address].   Other Instructions Thank you for choosing [facility]!

## 2024-05-08 NOTE — Progress Notes (Signed)
 Cardiology Office Note    Date:  05/08/2024  ID:  Taim, Wurm Jun 07, 1951, MRN [id number] Cardiologist: Alvan Carrier, MD { :  History of Present Illness:    Martin Hooper is a 73 y.o. male with past medical history of CAD (s/p PCI in 2000, 5V CABG in 2013 with LIMA-LAD, seq SVG-OM2-OM3, SVG-D1, and SVG-PDA, cath in 08/2022 showing 4/5 patent grafts and no targets for PCI), chronic HFimpEF, paroxysmal atrial fibrillation (9% burden by monitor in 11/2023), HTN, HLD, Type 2 DM, Stage 3 CKD and recurrent LV thrombus who presents to the office today for hospital follow-up.  He was most recently admitted to [facility] from 10/2 - 04/17/2024 for evaluation of chest pain and was found to have elevated troponin values, trending up to 112. Repeat echocardiogram showed his EF was at 50% and he did have wall motion abnormalities noted which had been documented on prior echocardiogram imaging as well. Was noted to still have a residual LV thrombus. Options were reviewed in regards to a repeat cardiac catheterization but he declined at that time given improvement in chest pain during admission. Given that he had a recurrent LV thrombus while on Eliquis , it was recommended to switch to Coumadin with Lovenox bridging. Was discharged on Amlodipine  5 mg daily, ASA 325 mg daily, Atorvastatin  80 mg daily, Benazepril  40 mg daily, Arixtra  (in place of Lovenox given alpha gal allergy), HCTZ 25 mg daily, Imdur  60 mg twice daily, Toprol -XL 50 mg daily and Coumadin. Was recommended to stop ASA after hospital discharge by review of phone notes.  He has followed up with [facility] since hospital discharge and INR was at 2.7 on most recent check on 04/29/2024.  In talking with the patient and his wife today, he reports overall doing well since his recent hospitalization. He does have stable angina when walking up a hill in his backyard but says this was occurring prior to his hospitalization as well and no acute  changes. He will sit down and his wife will massage his chest for a few minutes and pain resolves. He has not had to utilize sublingual nitroglycerin  since hospital discharge. Overall tolerating Imdur  60 mg twice daily well as he previously had worsening chest pain with taking 120 mg at once in the morning. Says he was gardening earlier this week without any chest pain or significant dyspnea on exertion. Denies orthopnea, PND, pitting edema or palpitations. On Coumadin for anticoagulation with no reports of melena, hematochezia or hematuria.  Studies Reviewed:   EKG: EKG is not ordered today.  Cardiac Catheterization: 08/2022   Prox RCA lesion is 50% stenosed.   Mid RCA lesion is 100% stenosed.   2nd Diag lesion is 100% stenosed.   Mid Cx lesion is 100% stenosed.   Mid Graft to Insertion lesion between 2nd Mrg and 3rd Mrg  is 99% stenosed.   Mid LAD lesion is 100% stenosed.   SVG graft was visualized by angiography.   SVG graft was visualized by angiography.   LIMA graft was visualized by non-selective angiography.   Severe three vessel CAD s/p 5V CABG with 4/5 patent bypass grafts Chronic occlusion mid LAD. The mid and distal LAD fills from the patent LIMA graft.The Diagonal branch fills from the patent vein graft The Circumflex is totally occluded in the mid segment. The sequential vein graft to the second and third OM branches is patent to the second OM branch but the distal limb of this graft to the  small third OM branch is occluded. There is collateral filling of this third OM branch from collaterals supplied through the SVG to Diagonal.  The large dominant RCA is occluded in the mid segment. Patent vein graft to the distal RCA   Recommendations: No focal targets for PCI. I suspect that his recent change in symptoms is related to the occlusion of the distal limb of the sequential vein graft to the most distal OM branch. This small branch fills from left to left collaterals. Continue  medical management of CAD.   Event Monitor: 01/2024   7 day monitor   Rare supraventricular ectopy in the form of isolated PACs, couplets, triplets   Rare ventricular ectopy in the form of isolated PVCs, couplets, triplets   No symptoms reported   Episodes of afib noted, 9% burden. Afib rates 48 to 123, avg 69 bpm.     Patch Wear Time:  6 days and 22 hours (2025-05-06T11:47:25-0400 to 2025-05-13T10:28:34-0400)   Patient had a min HR of 39 bpm, max HR of 123 bpm, and avg HR of 64 bpm. Predominant underlying rhythm was Sinus Rhythm. First Degree AV Block was present. Atrial Fibrillation/Flutter occurred (9% burden), ranging from 48-123 bpm (avg of 69 bpm), the  longest lasting 5 hours 37 mins with an avg rate of 67 bpm. Isolated SVEs were rare (<1.0%), SVE Couplets were rare (<1.0%), and SVE Triplets were rare (<1.0%). Isolated VEs were rare (<1.0%, 2730), VE Couplets were rare (<1.0%, 74), and VE Triplets were  rare (<1.0%, 1). Ventricular Bigeminy and Trigeminy were present. Previously notified: MD notification criteria for First Documentation of Atrial Flutter met - report posted prior to notification (EX).    Echocardiogram: 04/2024 IMPRESSIONS     1. Small thrombus seen in the apex adjacent to apical inferior and apical  septal wall with surrounding slow flow.. Left ventricular ejection  fraction, by estimation, is 50%. Left ventricular ejection fraction by 3D  volume is 45 %. The left ventricle has   low normal function. The left ventricle demonstrates regional wall motion  abnormalities (see scoring diagram/findings for description). There is  mild left ventricular hypertrophy. Left ventricular diastolic function  could not be evaluated.   2. Right ventricular systolic function is normal. The right ventricular  size is normal.   3. Left atrial size was mild to moderately dilated.   4. The mitral valve is normal in structure. Trivial mitral valve  regurgitation. No evidence of  mitral stenosis.   5. The aortic valve is tricuspid. Aortic valve regurgitation is not  visualized. No aortic stenosis is present.   6. Aortic dilatation noted. There is mild dilatation of the aortic root,  measuring 40 mm.   7. The inferior vena cava is normal in size with greater than 50%  respiratory variability, suggesting right atrial pressure of 3 mmHg.   Risk Assessment/Calculations:    CHA2DS2-VASc Score = 5  This indicates a 7.2% annual risk of stroke. The patient's score is based upon: CHF History: 1 HTN History: 1 Diabetes History: 1 Stroke History: 0 Vascular Disease History: 1 Age Score: 1 Gender Score: 0    Physical Exam:   VS:  BP 126/70 (BP Location: Right Arm, Cuff Size: Normal)   Pulse 74   Ht 5' 8 (1.727 m)   Wt 208 lb (94.3 kg)   SpO2 94%   BMI 31.63 kg/m    Wt Readings from Last 3 Encounters:  05/08/24 208 lb (94.3 kg)  04/16/24 200 lb (90.7  kg)  12/31/23 209 lb 12.8 oz (95.2 kg)     GEN: Well nourished, well developed male appearing in no acute distress NECK: No JVD; No carotid bruits CARDIAC: Irregularly irregular, no murmurs, rubs, gallops RESPIRATORY:  Clear to auscultation without rales, wheezing or rhonchi  ABDOMEN: Appears non-distended. No obvious abdominal masses. EXTREMITIES: No clubbing or cyanosis. 1+ pitting edema along LLE, trace edema along RLE.  Distal pedal pulses are 2+ bilaterally.   Assessment and Plan:   1. Coronary artery disease involving native heart with stable angina - He previously underwent CABG in 2013 and cardiac catheterization in 08/2022 showed 4 out of 5 patent grafts with no targets for PCI and medical management recommended at that time. - He has stable angina but no acute change in symptoms. We reviewed warning signs to monitor for. Continue current medical therapy for now with Atorvastatin  80 mg daily, Imdur  60 mg twice daily and Toprol -XL 50 mg daily. He is no longer on ASA given the need for  anticoagulation.  2. Left ventricular apical thrombus - Recent echocardiogram earlier this month showed a small thrombus along the apex which had been noted on prior imaging as well. Was switched from Eliquis  to Coumadin given residual thrombus. Overall tolerating well thus far and denies any bleeding issues. Would plan for a follow-up echocardiogram within the next 6 months for reassessment.  3. Atrial flutter, unspecified type (HCC) - Rates were overall well-controlled during his recent admission and heart rate is in the 70's during today's visit. Continue Toprol -XL 50 mg daily for rate-control. - Was previously on Eliquis  but switched to Coumadin during recent admission given his residual LV thrombus. INR was at 2.7 on most recent check.  4. Essential hypertension, benign - BP is well-controlled at 126/70 on today's visit. Continue Amlodipine  5 mg daily, Benazepril  40 mg daily, Hydrochlorothiazide  25mg  daily, Imdur  60mg  BID and Toprol -XL 50 mg daily. He does have some lower extremity edema on examination today and reports dietary indiscretion as he has been consuming sausage and bacon regularly. Reviewed the importance of reducing sodium intake. He was previously intolerant to compression stockings.  5. Hyperlipidemia LDL goal <70 - LDL was at 46 in 11/2023. Continue Atorvastatin  80 mg daily.   Signed, Laymon CHRISTELLA Qua, PA-C

## 2024-05-13 ENCOUNTER — Ambulatory Visit: Attending: Cardiology | Admitting: *Deleted

## 2024-05-13 DIAGNOSIS — Z5181 Encounter for therapeutic drug level monitoring: Secondary | ICD-10-CM

## 2024-05-13 DIAGNOSIS — I513 Intracardiac thrombosis, not elsewhere classified: Secondary | ICD-10-CM

## 2024-05-13 LAB — POCT INR: INR: 2.8 (ref 2.0–3.0)

## 2024-05-13 NOTE — Patient Instructions (Signed)
 Started warfarin 5mg  daily on 04/17/24 Continue warfarin 1 tablet daily Recheck in 3 wk

## 2024-05-13 NOTE — Progress Notes (Signed)
 INR-2.8 Please see anticoagulation encounter

## 2024-05-18 ENCOUNTER — Telehealth: Payer: Self-pay | Admitting: Cardiology

## 2024-05-18 MED ORDER — METOPROLOL SUCCINATE ER 50 MG PO TB24: 50.0000 mg | ORAL_TABLET | Freq: Every day | ORAL | 1 refills | Status: AC

## 2024-05-18 NOTE — Telephone Encounter (Signed)
*  STAT* If patient is at the pharmacy, call can be transferred to refill team.   1. Which medications need to be refilled? (please list name of each medication and dose if known) metoprolol  succinate (TOPROL -XL) 50 MG 24 hr tablet     4. Which pharmacy/location (including street and city if local pharmacy) is medication to be sent to? [facility] - [place], [place] - [address]     5. Do they need a 30 day or 90 day supply? 90    Next appt 09/09/24

## 2024-05-18 NOTE — Telephone Encounter (Signed)
 Refill sent to [facility].

## 2024-05-20 ENCOUNTER — Other Ambulatory Visit (HOSPITAL_BASED_OUTPATIENT_CLINIC_OR_DEPARTMENT_OTHER): Payer: Self-pay

## 2024-05-20 MED ORDER — GUAIFENESIN-CODEINE 100-10 MG/5ML PO SOLN
5.0000 mL | ORAL | 0 refills | Status: AC | PRN
  Filled 2024-05-20: qty 150, 5d supply, fill #0

## 2024-05-25 ENCOUNTER — Other Ambulatory Visit (HOSPITAL_COMMUNITY): Payer: Self-pay

## 2024-06-03 ENCOUNTER — Ambulatory Visit: Attending: Cardiology | Admitting: *Deleted

## 2024-06-03 DIAGNOSIS — Z5181 Encounter for therapeutic drug level monitoring: Secondary | ICD-10-CM | POA: Diagnosis not present

## 2024-06-03 DIAGNOSIS — I513 Intracardiac thrombosis, not elsewhere classified: Secondary | ICD-10-CM | POA: Diagnosis not present

## 2024-06-03 LAB — POCT INR: INR: 2.5 (ref 2.0–3.0)

## 2024-06-03 NOTE — Patient Instructions (Signed)
 Started warfarin 5mg  daily on 04/17/24 Continue warfarin 1 tablet daily Recheck in 4 wks

## 2024-06-03 NOTE — Progress Notes (Signed)
 INR 2.5. Please see anticoagulation encounter

## 2024-07-01 ENCOUNTER — Ambulatory Visit

## 2024-07-01 DIAGNOSIS — Z5181 Encounter for therapeutic drug level monitoring: Secondary | ICD-10-CM

## 2024-07-01 DIAGNOSIS — I513 Intracardiac thrombosis, not elsewhere classified: Secondary | ICD-10-CM

## 2024-07-01 LAB — POCT INR: INR: 4.4 — AB (ref 2.0–3.0)

## 2024-07-01 NOTE — Patient Instructions (Signed)
 Been on Abx x 1 wk and cough syrup Hold warfarin tonight, take 1/2 tablet tomorrow night then resume 1 tablet daily Recheck in 3 wks

## 2024-07-01 NOTE — Progress Notes (Signed)
 INR 4.4 Please see anticoagulation encounter

## 2024-07-02 ENCOUNTER — Telehealth: Payer: Self-pay | Admitting: Cardiology

## 2024-07-02 NOTE — Telephone Encounter (Signed)
°*  STAT* If patient is at the pharmacy, call can be transferred to refill team.   1. Which medications need to be refilled? (please list name of each medication and dose if known) warfarin (COUMADIN ) 5 MG tablet   isosorbide  mononitrate (IMDUR ) 60 MG 24 hr tablet (Expired)    2. Would you like to learn more about the convenience, safety, & potential cost savings by using the [facility]? No    3. Are you open to using the [facility] ([facility]. No    4. Which pharmacy/location (including street and city if local pharmacy) is medication to be sent to?[facility] - [place], [place] - [address]    5. Do they need a 30 day or 90 day supply? 90 day

## 2024-07-03 ENCOUNTER — Ambulatory Visit: Admitting: Nurse Practitioner

## 2024-07-03 MED ORDER — ISOSORBIDE MONONITRATE ER 30 MG PO TB24: 30.0000 mg | ORAL_TABLET | Freq: Every day | ORAL | 3 refills | Status: DC | PRN

## 2024-07-03 NOTE — Telephone Encounter (Signed)
 Refill sent

## 2024-07-10 ENCOUNTER — Ambulatory Visit: Admitting: Nurse Practitioner

## 2024-07-13 ENCOUNTER — Telehealth: Payer: Self-pay | Admitting: Cardiology

## 2024-07-13 NOTE — Telephone Encounter (Signed)
" °*  STAT* If patient is at the pharmacy, call can be transferred to refill team.   1. Which medications need to be refilled? (please list name of each medication and dose if known)  warfarin (COUMADIN ) 5 MG tablet   2. Would you like to learn more about the convenience, safety, & potential cost savings by using the [facility]? no   3. Are you open to using the [facility] ([facility]. no   4. Which pharmacy/location (including street and city if local pharmacy) is medication to be sent to?  [facility] - [place], [place] - [address]   5. Do they need a 30 day or 90 day supply?  30 day "

## 2024-07-14 MED ORDER — WARFARIN SODIUM 5 MG PO TABS: 5.0000 mg | ORAL_TABLET | Freq: Every day | ORAL | 5 refills | Status: AC

## 2024-07-14 NOTE — Telephone Encounter (Signed)
 Refill request for warfarin:  Last INR was 4.4 on 07/01/24 Next INR due 07/22/24 LOV was 05/08/24  Refill approved.

## 2024-07-22 ENCOUNTER — Ambulatory Visit: Attending: Cardiology | Admitting: *Deleted

## 2024-07-22 DIAGNOSIS — I513 Intracardiac thrombosis, not elsewhere classified: Secondary | ICD-10-CM

## 2024-07-22 DIAGNOSIS — Z5181 Encounter for therapeutic drug level monitoring: Secondary | ICD-10-CM

## 2024-07-22 LAB — POCT INR: INR: 2.9 (ref 2.0–3.0)

## 2024-07-22 NOTE — Progress Notes (Signed)
 INR 2.9; Please see anticoagulation encounter

## 2024-07-22 NOTE — Patient Instructions (Signed)
Continue warfarin 1 tablet daily Recheck in 4 wks 

## 2024-07-25 ENCOUNTER — Other Ambulatory Visit: Payer: Self-pay | Admitting: Internal Medicine

## 2024-07-30 ENCOUNTER — Telehealth: Payer: Self-pay | Admitting: Cardiology

## 2024-07-30 NOTE — Telephone Encounter (Signed)
 Pain has been constant with pain increasing in intensity. Pt scheduled on 1/21 with B.Strader, PA (okay per provider) at 9 am. Will add to wait list if anything sooner becomes available.

## 2024-07-30 NOTE — Telephone Encounter (Signed)
 Pt scheduled in [place] with E.Peck on tomorrow.

## 2024-07-30 NOTE — Telephone Encounter (Signed)
 Pt c/o of Chest Pain: STAT if active (IN THIS MOMENT) CP, including tightness, pressure, jaw pain, shoulder/upper arm/back pain, SOB, nausea, and vomiting.  1. Are you having CP right now (tightness, pressure, or discomfort)?  No   2. Are you experiencing any other symptoms (ex. SOB, nausea, vomiting, sweating)?  No   3. How long have you been experiencing CP?  Past few weeks, worsening  4. Is your CP continuous or coming and going?  Coming and going   5. Have you taken Nitroglycerin ?  Yes, but it it doesn't resolve the pain

## 2024-07-30 NOTE — Telephone Encounter (Signed)
 Saw PCP yesterday for pain underneath armpit with radiation to front of chest for past  2 weeks. He said he has been taking tylenol  for past week with little relief. NTG does not relieve, states his typical CP is resolved with NTG. He saw PCP yesterday and had CXR he says was ok.  He has not taken any extra Imdur  30 mg as he has prescribed.  Has f/u in February , please advise.

## 2024-07-31 ENCOUNTER — Ambulatory Visit: Attending: Nurse Practitioner | Admitting: Nurse Practitioner

## 2024-07-31 ENCOUNTER — Encounter: Payer: Self-pay | Admitting: Nurse Practitioner

## 2024-07-31 VITALS — BP 130/72 | HR 67 | Ht 68.0 in | Wt 211.8 lb

## 2024-07-31 DIAGNOSIS — R079 Chest pain, unspecified: Secondary | ICD-10-CM

## 2024-07-31 DIAGNOSIS — I10 Essential (primary) hypertension: Secondary | ICD-10-CM

## 2024-07-31 DIAGNOSIS — I5020 Unspecified systolic (congestive) heart failure: Secondary | ICD-10-CM

## 2024-07-31 DIAGNOSIS — I2510 Atherosclerotic heart disease of native coronary artery without angina pectoris: Secondary | ICD-10-CM | POA: Diagnosis not present

## 2024-07-31 DIAGNOSIS — R0789 Other chest pain: Secondary | ICD-10-CM | POA: Diagnosis not present

## 2024-07-31 DIAGNOSIS — E785 Hyperlipidemia, unspecified: Secondary | ICD-10-CM

## 2024-07-31 DIAGNOSIS — I4892 Unspecified atrial flutter: Secondary | ICD-10-CM | POA: Diagnosis not present

## 2024-07-31 DIAGNOSIS — I513 Intracardiac thrombosis, not elsewhere classified: Secondary | ICD-10-CM | POA: Diagnosis not present

## 2024-07-31 DIAGNOSIS — I482 Chronic atrial fibrillation, unspecified: Secondary | ICD-10-CM

## 2024-07-31 MED ORDER — NITROGLYCERIN 0.4 MG SL SUBL: 0.4000 mg | SUBLINGUAL_TABLET | SUBLINGUAL | 3 refills | Status: AC | PRN

## 2024-07-31 MED ORDER — ISOSORBIDE MONONITRATE ER 60 MG PO TB24: 90.0000 mg | ORAL_TABLET | Freq: Every day | ORAL | 2 refills | Status: AC

## 2024-07-31 NOTE — Progress Notes (Unsigned)
 " Cardiology Office Note:  .   Date: 12/31/2023 ID:  ADMIRAL MARCUCCI, DOB 1951-03-16, MRN [id number] PCP: Delsie Riggs, NP  [facility] Providers Cardiologist:  Alvan Carrier, MD    History of Present Illness: SABRA   SI JACHIM is a 74 y.o. male with a PMH of CAD, s/p CABG, HFimpEF, HTN, HLD, CKD stage 3, type 2 diabetes, hx of A-flutter, and hx of LV thrombus, who presents today for follow-up.   History of PCI in 2000.  Underwent 5 vessel CABG in 2013 with LIMA-LAD, sequential SVG-OM2-OM3, SVG-D1, and SVG-PDA.   Evaluated in October 2020 for an patient noted recent chest pains at the time, seem to occur with activity and improved with rest.  Found to be in a flutter that was newly diagnosed for him, was asymptomatic.  Imdur  increased to 120 mg daily.  Heart rate well-controlled while in a flutter.  Nurse visit in November 2024 showed patient had self converted to normal sinus rhythm.  Last seen by Laymon Qua, PA-C on August 09, 2023.  Denied any recurrent chest pain.  Noted occasional ankle edema, asymptomatic with a flutter.  Was concerned about carotid arteries, had family history with mother and aunt having known carotid artery disease.  Noted occasional facial numbness that resolved spontaneously, no neurological deficits per his report.  See carotid Doppler report noted below.  11/19/2023 - Today he presents for chest pain evaluation.  He describes it as left upper quadrant abdominal pain, believes this is due to gas/indigestion and says this radiates up to left axillary area.  Denies any overt chest pain.  He said this has been ongoing intermittently since he was 74 years old. Denies any chest pain, shortness of breath, palpitations, syncope, presyncope, dizziness, orthopnea, PND, swelling or significant weight changes, acute bleeding, or claudication.  12/31/2023 - Here for follow-up. Doing well. HR has improved since adjusting dose of Toprol  XL since last visit.  Continues to describe left upper quadrant abdominal pain, believes this is due to gas/indigestion and says this radiates up to left axillary area.  Denies any overt chest pain.  He said this has been ongoing intermittently since he was 74 years old. Denies any chest pain, shortness of breath, palpitations, syncope, presyncope, dizziness, orthopnea, PND, swelling or significant weight changes, acute bleeding, or claudication.  ROS: Negative.  See HPI.  Studies Reviewed: SABRA    EKG: EKG is not ordered today.   Cardiac monitor (preliminary report) 11/2023:  Patch Wear Time:  6 days and 22 hours (2025-05-06T11:47:25-0400 to 2025-05-13T10:28:34-0400)   Patient had a min HR of 39 bpm, max HR of 123 bpm, and avg HR of 64 bpm. Predominant underlying rhythm was Sinus Rhythm. First Degree AV Block was present. Atrial Fibrillation/Flutter occurred (9% burden), ranging from 48-123 bpm (avg of 69 bpm), the  longest lasting 5 hours 37 mins with an avg rate of 67 bpm. Isolated SVEs were rare (<1.0%), SVE Couplets were rare (<1.0%), and SVE Triplets were rare (<1.0%). Isolated VEs were rare (<1.0%, 2730), VE Couplets were rare (<1.0%, 74), and VE Triplets were  rare (<1.0%, 1). Ventricular Bigeminy and Trigeminy were present. Previously notified: MD notification criteria for First Documentation of Atrial Flutter met - report posted prior to notification (EX).  Carotid duplex 07/2023:  IMPRESSION: Right:   Heterogeneous and partially calcified plaque at the right carotid bifurcation, with discordant results regarding degree of stenosis by established duplex criteria. Peak velocity suggests 50%-69% stenosis, with the ICA/ CCA ratio  suggesting a lesser degree of stenosis. If establishing a more accurate degree of stenosis is required, cerebral angiogram should be considered, or as a second best test, CTA.   Left:   Color duplex indicates minimal heterogeneous and calcified plaque, with no hemodynamically  significant stenosis by duplex criteria in the extracranial cerebrovascular circulation.  Limited echo 01/2023: 1. Limited study with Definity  contrast.   2. Very small, intermittently seen apical LV thrombus noted with  surrounding slow flow and thrombotic material. 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   3. Left ventricular ejection fraction, by estimation, is 55 to 60%. The  left ventricle has normal function. The left ventricle demonstrates  regional wall motion abnormalities (see scoring diagram/findings for  description). There is mild concentric left  ventricular hypertrophy.   4. Right ventricular systolic function is normal. The right ventricular  size is normal.   5. The mitral valve is grossly normal. Trivial mitral valve  regurgitation.   6. The aortic valve is tricuspid. Aortic valve regurgitation is not  visualized.   7. Aortic dilatation noted. There is mild dilatation of the aortic root,  measuring 40 mm.   8. The inferior vena cava is normal in size with greater than 50%  respiratory variability, suggesting right atrial pressure of 3 mmHg.   Comparison(s): Prior images reviewed side by side. Formed apical LV  thrombus has decreased in size.  Left heart cath 08/2022:   Prox RCA lesion is 50% stenosed.   Mid RCA lesion is 100% stenosed.   2nd Diag lesion is 100% stenosed.   Mid Cx lesion is 100% stenosed.   Mid Graft to Insertion lesion between 2nd Mrg and 3rd Mrg  is 99% stenosed.   Mid LAD lesion is 100% stenosed.   SVG graft was visualized by angiography.   SVG graft was visualized by angiography.   LIMA graft was visualized by non-selective angiography.   Severe three vessel CAD s/p 5V CABG with 4/5 patent bypass grafts Chronic occlusion mid LAD. The mid and distal LAD fills from the patent LIMA graft.The Diagonal branch fills from the patent vein graft The Circumflex is totally occluded in the mid segment. The sequential vein graft to the second and third OM  branches is patent to the second OM branch but the distal limb of this graft to the small third OM branch is occluded. There is collateral filling of this third OM branch from collaterals supplied through the SVG to Diagonal.  The large dominant RCA is occluded in the mid segment. Patent vein graft to the distal RCA   Recommendations: No focal targets for PCI. I suspect that his recent change in symptoms is related to the occlusion of the distal limb of the sequential vein graft to the most distal OM branch. This small branch fills from left to left collaterals. Continue medical management of CAD.   Lexiscan  07/2022:   Findings are consistent with inferior infarction with moderate to high amount of peri-infarct ischemia. 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 The study is intermediate to high risk based on degree of inferior peri-infarct ischemia and decreased LVEF   No ST deviation was noted.   LV perfusion is abnormal. Large moderate to severe intensity inferior defect with moderate to high level of reversibility. Large severe intensity fixed apical defect.   Left ventricular function is abnormal. Nuclear stress EF: 47 %. The left ventricular ejection fraction is mildly decreased (45-54%). End diastolic cavity size is mildly enlarged.  Physical Exam:   VS:  There were  no vitals taken for this visit.   Wt Readings from Last 3 Encounters:  05/08/24 208 lb (94.3 kg)  04/16/24 200 lb (90.7 kg)  12/31/23 209 lb 12.8 oz (95.2 kg)    GEN: Obese, 74 year old male in no acute distress NECK: No JVD; No carotid bruits CARDIAC: S1/S2, RRR,, no murmurs, rubs, gallops RESPIRATORY:  Clear to auscultation without rales, wheezing or rhonchi  ABDOMEN: Soft, non-tender, non-distended EXTREMITIES:  No edema; No deformity   ASSESSMENT AND PLAN: .    1.  CAD, s/p CABG, atypical chest pain Very atypical presentation. Heart cath in 08/2022 revealed severe 3 vessel CAD with 4/5 patent bypass grafts. See full report above. There  were no focal targets for PCI. Denies any exertional component to his symptoms with LUQ pain radiating to left axillary area. Does not sound cardiac in nature. Not on aspirin  due to being on Eliquis .  Continue current medication regimen. Heart healthy diet and regular cardiovascular exercise encouraged. Care and ED precautions discussed.   2. HFimpEF, hx of LV thrombus Stage C, NYHA class I-II symptoms. EF 55-60%. Euvolemic and well compensated on exam.  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  Continue Eliquis  5 mg twice daily.   Continue rest of medication regimen. Low sodium diet, fluid restriction <2L, and daily weights encouraged. Educated to contact our office for weight gain of 2 lbs overnight or 5 lbs in one week.  4. HTN Blood pressure today at goal.  No medication changes at this time. Discussed to monitor BP at home at least 2 hours after medications and sitting for 5-10 minutes. Heart healthy diet and regular cardiovascular exercise encouraged.   4. HLD LDL 46 at most recent check with PCP. Continue current medication regimen. Heart healthy diet and regular cardiovascular exercise encouraged.   5. Hx of A-flutter Denies any tachycardia or palpitations. HR is well controlled. Continue Eliquis  as noted above.  No other medication changes at this time. Heart healthy diet and regular cardiovascular exercise encouraged.   3. Left upper quadrant abdominal pain Etiology unclear.  Not in acute distress today.  Recommend to follow-up with PCP for further evaluation.  Has seen GI in the past.  Recommended continue to follow-up with GI and PCP.  Care and ED precautions discussed.    Dispo: Follow-up with MD/APP in 6 months or sooner if anything changes.  Signed, Almarie Crate, NP   "

## 2024-07-31 NOTE — Patient Instructions (Addendum)
 Medication Instructions:  Your physician has recommended you make the following change in your medication:  Increase Isosorbide  to 90 mg twice daily  Continue taking all other medications as prescribed   Labwork: None  Testing/Procedures: None  Follow-Up: Your physician recommends that you schedule a follow-up appointment in: 6-8 weeks  Any Other Special Instructions Will Be Listed Below (If Applicable). Thank you for choosing [facility]!     If you need a refill on your cardiac medications before your next appointment, please call your pharmacy.

## 2024-08-03 ENCOUNTER — Encounter: Payer: Self-pay | Admitting: Internal Medicine

## 2024-08-05 ENCOUNTER — Ambulatory Visit: Admitting: Student

## 2024-08-19 ENCOUNTER — Ambulatory Visit: Admitting: *Deleted

## 2024-08-19 DIAGNOSIS — Z5181 Encounter for therapeutic drug level monitoring: Secondary | ICD-10-CM

## 2024-08-19 DIAGNOSIS — I513 Intracardiac thrombosis, not elsewhere classified: Secondary | ICD-10-CM

## 2024-08-19 LAB — POCT INR: INR: 3.9 — AB (ref 2.0–3.0)

## 2024-08-19 NOTE — Patient Instructions (Signed)
 Hold warfarin tonight then decrease dose to 1 tablet daily except 1/2 tablet on Sundays Recheck in 3 wks

## 2024-08-19 NOTE — Progress Notes (Signed)
 INR 3.9

## 2024-09-08 ENCOUNTER — Ambulatory Visit

## 2024-09-09 ENCOUNTER — Ambulatory Visit: Admitting: Student
